# Patient Record
Sex: Male | Born: 1935 | Race: White | Hispanic: No | Marital: Married | Smoking: Former smoker
Health system: Southern US, Community
[De-identification: ages and names within clinical notes are randomized; demographics above are authoritative.]

## PROBLEM LIST (undated history)

## (undated) DIAGNOSIS — R7303 Prediabetes: Secondary | ICD-10-CM

## (undated) DIAGNOSIS — E039 Hypothyroidism, unspecified: Secondary | ICD-10-CM

## (undated) DIAGNOSIS — M1990 Unspecified osteoarthritis, unspecified site: Secondary | ICD-10-CM

## (undated) DIAGNOSIS — H3530 Unspecified macular degeneration: Secondary | ICD-10-CM

## (undated) DIAGNOSIS — E7800 Pure hypercholesterolemia, unspecified: Secondary | ICD-10-CM

## (undated) DIAGNOSIS — I10 Essential (primary) hypertension: Secondary | ICD-10-CM

## (undated) DIAGNOSIS — N400 Enlarged prostate without lower urinary tract symptoms: Secondary | ICD-10-CM

## (undated) HISTORY — DX: Unspecified macular degeneration: H35.30

## (undated) HISTORY — DX: Hypothyroidism, unspecified: E03.9

## (undated) HISTORY — DX: Essential (primary) hypertension: I10

## (undated) HISTORY — DX: Unspecified osteoarthritis, unspecified site: M19.90

## (undated) HISTORY — DX: Benign prostatic hyperplasia without lower urinary tract symptoms: N40.0

## (undated) HISTORY — DX: Pure hypercholesterolemia, unspecified: E78.00

## (undated) HISTORY — DX: Prediabetes: R73.03

---

## 1998-08-12 ENCOUNTER — Other Ambulatory Visit: Admission: RE | Admit: 1998-08-12 | Discharge: 1998-08-12 | Payer: Self-pay | Admitting: Urology

## 2002-07-15 ENCOUNTER — Ambulatory Visit: Admission: RE | Admit: 2002-07-15 | Discharge: 2002-07-15 | Payer: Self-pay | Admitting: Family Medicine

## 2004-01-18 ENCOUNTER — Ambulatory Visit (HOSPITAL_COMMUNITY): Admission: RE | Admit: 2004-01-18 | Discharge: 2004-01-18 | Payer: Self-pay | Admitting: Family Medicine

## 2015-05-02 DIAGNOSIS — E039 Hypothyroidism, unspecified: Secondary | ICD-10-CM | POA: Diagnosis not present

## 2015-05-02 DIAGNOSIS — R51 Headache: Secondary | ICD-10-CM | POA: Diagnosis not present

## 2015-05-02 DIAGNOSIS — E119 Type 2 diabetes mellitus without complications: Secondary | ICD-10-CM | POA: Diagnosis not present

## 2015-05-02 DIAGNOSIS — I10 Essential (primary) hypertension: Secondary | ICD-10-CM | POA: Diagnosis not present

## 2015-05-02 DIAGNOSIS — N400 Enlarged prostate without lower urinary tract symptoms: Secondary | ICD-10-CM | POA: Diagnosis not present

## 2015-05-02 DIAGNOSIS — E7800 Pure hypercholesterolemia, unspecified: Secondary | ICD-10-CM | POA: Diagnosis not present

## 2015-05-09 ENCOUNTER — Encounter: Payer: Self-pay | Admitting: Neurology

## 2015-05-09 ENCOUNTER — Ambulatory Visit (INDEPENDENT_AMBULATORY_CARE_PROVIDER_SITE_OTHER): Payer: Medicare Other | Admitting: Neurology

## 2015-05-09 VITALS — BP 171/68 | HR 65 | Ht 65.0 in | Wt 188.0 lb

## 2015-05-09 DIAGNOSIS — E038 Other specified hypothyroidism: Secondary | ICD-10-CM

## 2015-05-09 DIAGNOSIS — I10 Essential (primary) hypertension: Secondary | ICD-10-CM | POA: Insufficient documentation

## 2015-05-09 DIAGNOSIS — E039 Hypothyroidism, unspecified: Secondary | ICD-10-CM | POA: Insufficient documentation

## 2015-05-09 DIAGNOSIS — H3530 Unspecified macular degeneration: Secondary | ICD-10-CM | POA: Diagnosis not present

## 2015-05-09 DIAGNOSIS — M5481 Occipital neuralgia: Secondary | ICD-10-CM

## 2015-05-09 MED ORDER — GABAPENTIN 300 MG PO CAPS: 300.0000 mg | ORAL_CAPSULE | Freq: Three times a day (TID) | ORAL | Status: DC | PRN

## 2015-05-09 NOTE — Progress Notes (Addendum)
[facility]    Provider:  Dr Lucia Gaskins Referring Provider: Merri Brunette, MD Primary Care Physician:  Allean Found, MD  CC:  headaches  HPI:  Marvin Brooks is a 82 y.o. male here as a referral from Dr. Katrinka Blazing for headaches. PMHx HTN, hypothyroidism, macular degeneration, glucose intolerance. Patient describes pain like a flash of pain behind the ear and would switch sides. Started about 3 weeks ago. Nothing happened 3 weekds ago, no inciting factors. He feels swollen behind the ears. A little flash of pain. It can radiate up from the skull base.  He can't describe it. Not sharp or pointed just a wide section of popping, he points to the occipital and posterior auricular area. Getting better over the last 3 weeks. Today he doesn't have any pain. It would last off and on all day. It would be unilateral but once was bilateral and extremely painful, would last all day. Heating pad would help. Tylenol helps. Every few seconds would feel pain in the area. A throbbing pain. No tearing of the eyes, no runny nose, no autonomic symptoms. No new vision changes. He may have had it a few years ago and resolved. He has no history if headaches. No associated symptoms. No neck pain or radicular symptoms. The area can be sensitive and feel swollen.   Reviewed notes, labs and imaging from outside physicians, which showed:  ldl 65, cmp unremarkable with creatinine 1.12 on 05/02/2015. a1c 5.7. tsh 1.31, sed rate 23, cbc unremarkable.   Reviewed records from [place]. Patient has prediabetes, HTn on amlodipine, HLD on atorvastatin, enlarged prostate on terazosin, bilateral headaches behind the ears and was referred to [place] for evaluation. Sed rate was normal and no vision changes, jaw claudication, neck pain or fevers.   Review of Systems: Patient complains of symptoms per HPI as well as the following symptoms: loss of vision, swelling in legs, joint pain, joint swelling, cramps, aching muscles,  restless legs. Pertinent negatives per HPI. All others negative.   Social History   Social History  . Marital Status: Single    Spouse Name: N/A  . Number of Children: N/A  . Years of Education: N/A   Occupational History  . Not on file.   Social History Main Topics  . Smoking status: Former Games developer  . Smokeless tobacco: Not on file  . Alcohol Use: No  . Drug Use: Not on file  . Sexual Activity: Not on file   Other Topics Concern  . Not on file   Social History Narrative  . No narrative on file    Family History  Problem Relation Age of Onset  . Migraines Father     Past Medical History  Diagnosis Date  . Hypertension   . BPH (benign prostatic hyperplasia)   . Arthritis   . Hypothyroidism   . Hypercholesteremia   . Pre-diabetes   . Macular degeneration     History reviewed. No pertinent past surgical history.  Current Outpatient Prescriptions  Medication Sig Dispense Refill  . amLODipine (NORVASC) 10 MG tablet TK 1 T PO QD  1  . atorvastatin (LIPITOR) 40 MG tablet TK 1 T PO QD  1  . finasteride (PROSCAR) 5 MG tablet TK 1 T PO QD  1  . levothyroxine (SYNTHROID, LEVOTHROID) 100 MCG tablet TK 1 T PO QD  1  . terazosin (HYTRIN) 10 MG capsule TK 1 C PO QD  1   No current facility-administered medications for this visit.  Allergies as of 05/09/2015 - Review Complete 05/09/2015  Allergen Reaction Noted  . Sulfur  05/09/2015    Vitals: BP 171/68 mmHg  Pulse 65  Ht  (1.651 m)  Wt 188 lb (85.276 kg)  BMI 31.28 kg/m2 Last Weight:  Wt Readings from Last 1 Encounters:  05/09/15 188 lb (85.276 kg)   Last Height:   Ht Readings from Last 1 Encounters:  05/09/15  (1.651 m)    Physical exam: Exam: Gen: NAD, conversant, well nourised, obese, well groomed                     CV: RRR, no MRG. No Carotid Bruits. No peripheral edema, warm, nontender Eyes: Conjunctivae clear without exudates or hemorrhage  Neuro: Detailed Neurologic  Exam  Speech:    Speech is normal; fluent and spontaneous with normal comprehension.  Cognition:    The patient is oriented to person, place, and time;     recent and remote memory intact;     language fluent;     normal attention, concentration,     fund of knowledge Cranial Nerves:    The pupils are equal, round, and reactive to light. Attempted could not visualize the fundi.  Visual fields are impaired to finger confrontation due to macular degeneration. Extraocular movements are intact. Trigeminal sensation is intact and the muscles of mastication are normal. The face is symmetric. The palate elevates in the midline. Hearing intact. Voice is normal. Shoulder shrug is normal. The tongue has normal motion without fasciculations.   Coordination:    Normal finger to nose and heel to shin.   Gait:    Wide based  Motor Observation:    No asymmetry, no atrophy, and no involuntary movements noted. Tone:    Normal muscle tone.    Posture:    Posture is normal. normal erect    Strength:    Strength is V/V in the upper and lower limbs.      Sensation: intact to LT     Reflex Exam:  DTR's: Absent AJs otherwise deep tendon reflexes in the upper and lower extremities are normal bilaterally.   Toes:    The toes are downgoing bilaterally.   Clonus:    Clonus is absent.     Assessment/Plan:  82  year old male with new onset pain behind the ears and radiating to the skull base in the occipital area. Pain is bilateral and is located in the distribution of the lesser and/or great auricular nerves, paroxysmal and brief, painful, with tenderness at the emergence of the lesser occipital nerve at the skull base. Differential includes pain in the upper cervical joints or disks, suboccipital or upper posterior neck muscles including the traps/scm, spinal and posterior cranial fossa dura mater, vertebral arteries, structural and infiltrative lesions such as meningioma, schwannoma, myelitis,  compressive disk disease and others. Will need MRI of the brain and cervical spine.   Patient declines MRI of the brain and cervical spine at this time. He says that he is feeling better. Advise patient if symptoms recur should order imaging of the back for nerve blocks. We'll order Neurontin at this time as needed for pain. Return as needed symptoms recur.  To prevent or relieve headaches, try the following: Cool Compress. Lie down and place a cool compress on your head.  Avoid headache triggers. If certain foods or odors seem to have triggered your migraines in the past, avoid them. A headache diary might help you  identify triggers.  Include physical activity in your daily routine. Try a daily walk or other moderate aerobic exercise.  Manage stress. Find healthy ways to cope with the stressors, such as delegating tasks on your to-do list.  Practice relaxation techniques. Try deep breathing, yoga, massage and visualization.  Eat regularly. Eating regularly scheduled meals and maintaining a healthy diet might help prevent headaches. Also, drink plenty of fluids.  Follow a regular sleep schedule. Sleep deprivation might contribute to headaches Consider biofeedback. With this mind-body technique, you learn to control certain bodily functions - such as muscle tension, heart rate and blood pressure - to prevent headaches or reduce headache pain.    Proceed to emergency room if you experience new or worsening symptoms or symptoms do not resolve, if you have new neurologic symptoms or if headache is severe, or for any concerning symptom.   There are multiple side effects from Neurontin and most common or the following: dizziness, drowsiness, weakness, tired feeling, nausea, diarrhea, constipation, blurred vision, headache, breast swelling, dry mouth, loss of balance or coordination. But stopped for anything concerning. Take medication on weekend when not driving. Do not drive or operate machinery until he  now the medication works.   Naomie Dean, MD  [facility] [address] [place], [place] [address]  Phone [phone] Fax [phone]

## 2015-05-09 NOTE — Patient Instructions (Signed)
Remember to drink plenty of fluid, eat healthy meals and do not skip any meals. Try to eat protein with a every meal and eat a healthy snack such as fruit or nuts in between meals. Try to keep a regular sleep-wake schedule and try to exercise daily, particularly in the form of walking, 20-30 minutes a day, if you can.   As far as your medications are concerned, I would like to suggest: Neurontin  as needed three times a day  As far as diagnostic testing: recommend mri of the brain and cervical spine if symptoms return  I would like to see you back as needed, sooner if we need to. Please call us with any interim questions, concerns, problems, updates or refill requests.   Our phone number is [phone]. We also have an after hours call service for urgent matters and there is a physician on-call for urgent questions. For any emergencies you know to call 911 or go to the nearest emergency room

## 2015-09-07 DIAGNOSIS — H353134 Nonexudative age-related macular degeneration, bilateral, advanced atrophic with subfoveal involvement: Secondary | ICD-10-CM | POA: Diagnosis not present

## 2015-11-02 DIAGNOSIS — E7800 Pure hypercholesterolemia, unspecified: Secondary | ICD-10-CM | POA: Diagnosis not present

## 2015-11-02 DIAGNOSIS — E039 Hypothyroidism, unspecified: Secondary | ICD-10-CM | POA: Diagnosis not present

## 2015-11-02 DIAGNOSIS — I10 Essential (primary) hypertension: Secondary | ICD-10-CM | POA: Diagnosis not present

## 2015-11-02 DIAGNOSIS — N400 Enlarged prostate without lower urinary tract symptoms: Secondary | ICD-10-CM | POA: Diagnosis not present

## 2015-11-02 DIAGNOSIS — E119 Type 2 diabetes mellitus without complications: Secondary | ICD-10-CM | POA: Diagnosis not present

## 2015-11-30 DIAGNOSIS — Z23 Encounter for immunization: Secondary | ICD-10-CM | POA: Diagnosis not present

## 2015-12-12 DIAGNOSIS — H524 Presbyopia: Secondary | ICD-10-CM | POA: Diagnosis not present

## 2016-01-03 DIAGNOSIS — H26492 Other secondary cataract, left eye: Secondary | ICD-10-CM | POA: Diagnosis not present

## 2016-09-18 DIAGNOSIS — Z1389 Encounter for screening for other disorder: Secondary | ICD-10-CM | POA: Diagnosis not present

## 2016-09-18 DIAGNOSIS — E039 Hypothyroidism, unspecified: Secondary | ICD-10-CM | POA: Diagnosis not present

## 2016-09-18 DIAGNOSIS — Z0000 Encounter for general adult medical examination without abnormal findings: Secondary | ICD-10-CM | POA: Diagnosis not present

## 2016-09-18 DIAGNOSIS — I10 Essential (primary) hypertension: Secondary | ICD-10-CM | POA: Diagnosis not present

## 2016-12-14 DIAGNOSIS — H353134 Nonexudative age-related macular degeneration, bilateral, advanced atrophic with subfoveal involvement: Secondary | ICD-10-CM | POA: Diagnosis not present

## 2016-12-26 DIAGNOSIS — Z23 Encounter for immunization: Secondary | ICD-10-CM | POA: Diagnosis not present

## 2017-05-21 DIAGNOSIS — N400 Enlarged prostate without lower urinary tract symptoms: Secondary | ICD-10-CM | POA: Diagnosis not present

## 2017-05-21 DIAGNOSIS — I10 Essential (primary) hypertension: Secondary | ICD-10-CM | POA: Diagnosis not present

## 2017-05-21 DIAGNOSIS — E119 Type 2 diabetes mellitus without complications: Secondary | ICD-10-CM | POA: Diagnosis not present

## 2017-05-21 DIAGNOSIS — E039 Hypothyroidism, unspecified: Secondary | ICD-10-CM | POA: Diagnosis not present

## 2017-07-31 DIAGNOSIS — N5089 Other specified disorders of the male genital organs: Secondary | ICD-10-CM | POA: Diagnosis not present

## 2017-07-31 DIAGNOSIS — R310 Gross hematuria: Secondary | ICD-10-CM | POA: Diagnosis not present

## 2017-07-31 DIAGNOSIS — R351 Nocturia: Secondary | ICD-10-CM | POA: Diagnosis not present

## 2017-07-31 DIAGNOSIS — R350 Frequency of micturition: Secondary | ICD-10-CM | POA: Diagnosis not present

## 2017-08-07 DIAGNOSIS — N401 Enlarged prostate with lower urinary tract symptoms: Secondary | ICD-10-CM | POA: Diagnosis not present

## 2017-08-07 DIAGNOSIS — R310 Gross hematuria: Secondary | ICD-10-CM | POA: Diagnosis not present

## 2017-08-07 DIAGNOSIS — N281 Cyst of kidney, acquired: Secondary | ICD-10-CM | POA: Diagnosis not present

## 2017-08-27 DIAGNOSIS — R310 Gross hematuria: Secondary | ICD-10-CM | POA: Diagnosis not present

## 2017-08-27 DIAGNOSIS — N281 Cyst of kidney, acquired: Secondary | ICD-10-CM | POA: Diagnosis not present

## 2017-08-27 DIAGNOSIS — R350 Frequency of micturition: Secondary | ICD-10-CM | POA: Diagnosis not present

## 2017-08-27 DIAGNOSIS — C641 Malignant neoplasm of right kidney, except renal pelvis: Secondary | ICD-10-CM | POA: Diagnosis not present

## 2017-09-30 DIAGNOSIS — Z1389 Encounter for screening for other disorder: Secondary | ICD-10-CM | POA: Diagnosis not present

## 2017-09-30 DIAGNOSIS — Z0000 Encounter for general adult medical examination without abnormal findings: Secondary | ICD-10-CM | POA: Diagnosis not present

## 2017-09-30 DIAGNOSIS — I10 Essential (primary) hypertension: Secondary | ICD-10-CM | POA: Diagnosis not present

## 2017-09-30 DIAGNOSIS — E039 Hypothyroidism, unspecified: Secondary | ICD-10-CM | POA: Diagnosis not present

## 2018-02-11 DIAGNOSIS — C641 Malignant neoplasm of right kidney, except renal pelvis: Secondary | ICD-10-CM | POA: Diagnosis not present

## 2018-02-19 DIAGNOSIS — C641 Malignant neoplasm of right kidney, except renal pelvis: Secondary | ICD-10-CM | POA: Diagnosis not present

## 2018-02-19 DIAGNOSIS — N2889 Other specified disorders of kidney and ureter: Secondary | ICD-10-CM | POA: Diagnosis not present

## 2018-02-24 DIAGNOSIS — N281 Cyst of kidney, acquired: Secondary | ICD-10-CM | POA: Diagnosis not present

## 2018-02-24 DIAGNOSIS — R310 Gross hematuria: Secondary | ICD-10-CM | POA: Diagnosis not present

## 2018-02-24 DIAGNOSIS — C641 Malignant neoplasm of right kidney, except renal pelvis: Secondary | ICD-10-CM | POA: Diagnosis not present

## 2018-02-24 DIAGNOSIS — R351 Nocturia: Secondary | ICD-10-CM | POA: Diagnosis not present

## 2018-03-31 DIAGNOSIS — E7800 Pure hypercholesterolemia, unspecified: Secondary | ICD-10-CM | POA: Diagnosis not present

## 2018-03-31 DIAGNOSIS — I10 Essential (primary) hypertension: Secondary | ICD-10-CM | POA: Diagnosis not present

## 2018-03-31 DIAGNOSIS — E039 Hypothyroidism, unspecified: Secondary | ICD-10-CM | POA: Diagnosis not present

## 2018-03-31 DIAGNOSIS — E1169 Type 2 diabetes mellitus with other specified complication: Secondary | ICD-10-CM | POA: Diagnosis not present

## 2018-06-15 ENCOUNTER — Inpatient Hospital Stay (HOSPITAL_COMMUNITY)
Admission: EM | Admit: 2018-06-15 | Discharge: 2018-06-17 | DRG: 193 | Disposition: A | Discharge status: Home Health | Payer: Medicare Other | Attending: Internal Medicine | Admitting: Internal Medicine

## 2018-06-15 ENCOUNTER — Emergency Department (HOSPITAL_COMMUNITY): Payer: Medicare Other

## 2018-06-15 ENCOUNTER — Other Ambulatory Visit: Payer: Self-pay

## 2018-06-15 ENCOUNTER — Encounter (HOSPITAL_COMMUNITY): Payer: Self-pay | Admitting: Pharmacy Technician

## 2018-06-15 DIAGNOSIS — I10 Essential (primary) hypertension: Secondary | ICD-10-CM | POA: Diagnosis present

## 2018-06-15 DIAGNOSIS — M5481 Occipital neuralgia: Secondary | ICD-10-CM | POA: Diagnosis not present

## 2018-06-15 DIAGNOSIS — Z9181 History of falling: Secondary | ICD-10-CM

## 2018-06-15 DIAGNOSIS — I491 Atrial premature depolarization: Secondary | ICD-10-CM | POA: Diagnosis not present

## 2018-06-15 DIAGNOSIS — J811 Chronic pulmonary edema: Secondary | ICD-10-CM | POA: Diagnosis not present

## 2018-06-15 DIAGNOSIS — Z79899 Other long term (current) drug therapy: Secondary | ICD-10-CM | POA: Diagnosis not present

## 2018-06-15 DIAGNOSIS — I440 Atrioventricular block, first degree: Secondary | ICD-10-CM | POA: Diagnosis not present

## 2018-06-15 DIAGNOSIS — D649 Anemia, unspecified: Secondary | ICD-10-CM | POA: Diagnosis not present

## 2018-06-15 DIAGNOSIS — N400 Enlarged prostate without lower urinary tract symptoms: Secondary | ICD-10-CM | POA: Diagnosis not present

## 2018-06-15 DIAGNOSIS — J189 Pneumonia, unspecified organism: Secondary | ICD-10-CM | POA: Diagnosis not present

## 2018-06-15 DIAGNOSIS — Z87891 Personal history of nicotine dependence: Secondary | ICD-10-CM | POA: Diagnosis not present

## 2018-06-15 DIAGNOSIS — J810 Acute pulmonary edema: Secondary | ICD-10-CM | POA: Diagnosis not present

## 2018-06-15 DIAGNOSIS — R05 Cough: Secondary | ICD-10-CM | POA: Diagnosis not present

## 2018-06-15 DIAGNOSIS — R042 Hemoptysis: Secondary | ICD-10-CM | POA: Diagnosis not present

## 2018-06-15 DIAGNOSIS — R0600 Dyspnea, unspecified: Secondary | ICD-10-CM

## 2018-06-15 DIAGNOSIS — J069 Acute upper respiratory infection, unspecified: Secondary | ICD-10-CM | POA: Diagnosis present

## 2018-06-15 DIAGNOSIS — J849 Interstitial pulmonary disease, unspecified: Secondary | ICD-10-CM

## 2018-06-15 DIAGNOSIS — R509 Fever, unspecified: Secondary | ICD-10-CM

## 2018-06-15 DIAGNOSIS — E038 Other specified hypothyroidism: Secondary | ICD-10-CM | POA: Diagnosis not present

## 2018-06-15 DIAGNOSIS — E039 Hypothyroidism, unspecified: Secondary | ICD-10-CM | POA: Diagnosis present

## 2018-06-15 DIAGNOSIS — H3530 Unspecified macular degeneration: Secondary | ICD-10-CM | POA: Diagnosis not present

## 2018-06-15 DIAGNOSIS — Z882 Allergy status to sulfonamides status: Secondary | ICD-10-CM | POA: Diagnosis not present

## 2018-06-15 DIAGNOSIS — E785 Hyperlipidemia, unspecified: Secondary | ICD-10-CM | POA: Diagnosis not present

## 2018-06-15 DIAGNOSIS — Z79890 Hormone replacement therapy (postmenopausal): Secondary | ICD-10-CM

## 2018-06-15 DIAGNOSIS — M2550 Pain in unspecified joint: Secondary | ICD-10-CM | POA: Diagnosis not present

## 2018-06-15 DIAGNOSIS — D72810 Lymphocytopenia: Secondary | ICD-10-CM | POA: Diagnosis not present

## 2018-06-15 DIAGNOSIS — J9601 Acute respiratory failure with hypoxia: Secondary | ICD-10-CM | POA: Diagnosis not present

## 2018-06-15 DIAGNOSIS — R7303 Prediabetes: Secondary | ICD-10-CM | POA: Diagnosis present

## 2018-06-15 DIAGNOSIS — Z209 Contact with and (suspected) exposure to unspecified communicable disease: Secondary | ICD-10-CM | POA: Diagnosis not present

## 2018-06-15 DIAGNOSIS — R0902 Hypoxemia: Secondary | ICD-10-CM | POA: Diagnosis not present

## 2018-06-15 DIAGNOSIS — I959 Hypotension, unspecified: Secondary | ICD-10-CM | POA: Diagnosis not present

## 2018-06-15 DIAGNOSIS — Z20828 Contact with and (suspected) exposure to other viral communicable diseases: Secondary | ICD-10-CM | POA: Diagnosis present

## 2018-06-15 DIAGNOSIS — Z7401 Bed confinement status: Secondary | ICD-10-CM | POA: Diagnosis not present

## 2018-06-15 LAB — CBC WITH DIFFERENTIAL/PLATELET
Abs Immature Granulocytes: 0.03 10*3/uL (ref 0.00–0.07)
Basophils Absolute: 0 10*3/uL (ref 0.0–0.1)
Basophils Relative: 0 %
Eosinophils Absolute: 0 10*3/uL (ref 0.0–0.5)
Eosinophils Relative: 0 %
HCT: 34.8 % — ABNORMAL LOW (ref 39.0–52.0)
Hemoglobin: 10.7 g/dL — ABNORMAL LOW (ref 13.0–17.0)
Immature Granulocytes: 0 %
Lymphocytes Relative: 3 %
Lymphs Abs: 0.3 10*3/uL — ABNORMAL LOW (ref 0.7–4.0)
MCH: 28.2 pg (ref 26.0–34.0)
MCHC: 30.7 g/dL (ref 30.0–36.0)
MCV: 91.6 fL (ref 80.0–100.0)
Monocytes Absolute: 0.3 10*3/uL (ref 0.1–1.0)
Monocytes Relative: 4 %
Neutro Abs: 7.4 10*3/uL (ref 1.7–7.7)
Neutrophils Relative %: 93 %
Platelets: 145 10*3/uL — ABNORMAL LOW (ref 150–400)
RBC: 3.8 MIL/uL — ABNORMAL LOW (ref 4.22–5.81)
RDW: 13.2 % (ref 11.5–15.5)
WBC: 8.1 10*3/uL (ref 4.0–10.5)
nRBC: 0 % (ref 0.0–0.2)

## 2018-06-15 LAB — URINALYSIS, ROUTINE W REFLEX MICROSCOPIC
Bilirubin Urine: NEGATIVE
Glucose, UA: NEGATIVE mg/dL
Hgb urine dipstick: NEGATIVE
Ketones, ur: 5 mg/dL — AB
Leukocytes,Ua: NEGATIVE
Nitrite: NEGATIVE
Protein, ur: 100 mg/dL — AB
Specific Gravity, Urine: 1.03 (ref 1.005–1.030)
pH: 5 (ref 5.0–8.0)

## 2018-06-15 LAB — COMPREHENSIVE METABOLIC PANEL
ALT: 13 U/L (ref 0–44)
AST: 19 U/L (ref 15–41)
Albumin: 3.3 g/dL — ABNORMAL LOW (ref 3.5–5.0)
Alkaline Phosphatase: 88 U/L (ref 38–126)
Anion gap: 8 (ref 5–15)
BUN: 24 mg/dL — ABNORMAL HIGH (ref 8–23)
CO2: 19 mmol/L — ABNORMAL LOW (ref 22–32)
Calcium: 9.1 mg/dL (ref 8.9–10.3)
Chloride: 113 mmol/L — ABNORMAL HIGH (ref 98–111)
Creatinine, Ser: 1.24 mg/dL (ref 0.61–1.24)
GFR calc Af Amer: 59 mL/min — ABNORMAL LOW (ref >60)
GFR calc non Af Amer: 51 mL/min — ABNORMAL LOW (ref >60)
Glucose, Bld: 170 mg/dL — ABNORMAL HIGH (ref 70–99)
Potassium: 3.7 mmol/L (ref 3.5–5.1)
Sodium: 140 mmol/L (ref 135–145)
Total Bilirubin: 0.7 mg/dL (ref 0.3–1.2)
Total Protein: 6.5 g/dL (ref 6.5–8.1)

## 2018-06-15 LAB — LACTIC ACID, PLASMA
Lactic Acid, Venous: 1.3 mmol/L (ref 0.5–1.9)
Lactic Acid, Venous: 1 mmol/L (ref 0.5–1.9)

## 2018-06-15 LAB — LACTATE DEHYDROGENASE: LDH: 156 U/L (ref 98–192)

## 2018-06-15 LAB — INFLUENZA PANEL BY PCR (TYPE A & B)
Influenza A By PCR: NEGATIVE
Influenza B By PCR: NEGATIVE

## 2018-06-15 LAB — C-REACTIVE PROTEIN: CRP: 1.4 mg/dL — ABNORMAL HIGH (ref <1.0)

## 2018-06-15 LAB — I-STAT TROPONIN, ED: Troponin i, poc: 0.02 ng/mL (ref 0.00–0.08)

## 2018-06-15 MED ORDER — SODIUM CHLORIDE 0.9 % IV SOLN
1.0000 g | INTRAVENOUS | Status: DC
  Administered 2018-06-16: 1 g via INTRAVENOUS
  Filled 2018-06-15: qty 10

## 2018-06-15 MED ORDER — GABAPENTIN 300 MG PO CAPS
300.0000 mg | ORAL_CAPSULE | Freq: Every day | ORAL | Status: DC
  Administered 2018-06-15 – 2018-06-16 (×2): 300 mg via ORAL
  Filled 2018-06-15 (×2): qty 1

## 2018-06-15 MED ORDER — ONDANSETRON HCL 4 MG/2ML IJ SOLN: 4.0000 mg | Freq: Four times a day (QID) | INTRAMUSCULAR | Status: DC | PRN

## 2018-06-15 MED ORDER — ONDANSETRON HCL 4 MG PO TABS: 4.0000 mg | ORAL_TABLET | Freq: Four times a day (QID) | ORAL | Status: DC | PRN

## 2018-06-15 MED ORDER — SODIUM CHLORIDE 0.9 % IV SOLN
500.0000 mg | Freq: Once | INTRAVENOUS | Status: AC
  Administered 2018-06-15: 500 mg via INTRAVENOUS
  Filled 2018-06-15: qty 500

## 2018-06-15 MED ORDER — AMLODIPINE BESYLATE 5 MG PO TABS
5.0000 mg | ORAL_TABLET | Freq: Every day | ORAL | Status: DC
  Administered 2018-06-16 – 2018-06-17 (×2): 5 mg via ORAL
  Filled 2018-06-15 (×2): qty 1

## 2018-06-15 MED ORDER — ATORVASTATIN CALCIUM 40 MG PO TABS
40.0000 mg | ORAL_TABLET | Freq: Every day | ORAL | Status: DC
  Administered 2018-06-16: 40 mg via ORAL
  Filled 2018-06-15: qty 1

## 2018-06-15 MED ORDER — ACETAMINOPHEN 325 MG PO TABS
650.0000 mg | ORAL_TABLET | Freq: Once | ORAL | Status: AC
  Administered 2018-06-15: 650 mg via ORAL
  Filled 2018-06-15: qty 2

## 2018-06-15 MED ORDER — FINASTERIDE 5 MG PO TABS
5.0000 mg | ORAL_TABLET | Freq: Every day | ORAL | Status: DC
  Administered 2018-06-16 – 2018-06-17 (×2): 5 mg via ORAL
  Filled 2018-06-15 (×2): qty 1

## 2018-06-15 MED ORDER — AZITHROMYCIN 250 MG PO TABS
500.0000 mg | ORAL_TABLET | ORAL | Status: DC
  Administered 2018-06-16: 500 mg via ORAL
  Filled 2018-06-15: qty 2

## 2018-06-15 MED ORDER — LEVOTHYROXINE SODIUM 100 MCG PO TABS
100.0000 ug | ORAL_TABLET | Freq: Every day | ORAL | Status: DC
  Administered 2018-06-16: 100 ug via ORAL
  Filled 2018-06-15 (×2): qty 1

## 2018-06-15 MED ORDER — ENOXAPARIN SODIUM 40 MG/0.4ML ~~LOC~~ SOLN
40.0000 mg | SUBCUTANEOUS | Status: DC
  Administered 2018-06-15 – 2018-06-16 (×2): 40 mg via SUBCUTANEOUS
  Filled 2018-06-15 (×3): qty 0.4

## 2018-06-15 MED ORDER — SODIUM CHLORIDE 0.9 % IV SOLN
1.0000 g | Freq: Once | INTRAVENOUS | Status: AC
  Administered 2018-06-15: 1 g via INTRAVENOUS
  Filled 2018-06-15: qty 10

## 2018-06-15 NOTE — ED Notes (Signed)
Pt in by EMS following a fall into the tub while in the BR.  States legs became weak and unable to stand.   EMS arrival able to stand with (A) only.  Alert and oriented.  No neuro deficits x 4 extremities.

## 2018-06-15 NOTE — ED Provider Notes (Signed)
Medical screening examination/treatment/procedure(s) were conducted as a shared visit with non-physician practitioner(s) and myself.  I personally evaluated the patient during the encounter.  Patient presents to the ED for shortness of breath and fever.  Patient is hemodynamically stable but he is tachypneic and has borderline O2 sats requires nasal cannula oxygen.  Patient likely has covid 19.  Plan on admission for further treatment.  Marvin Brooks was evaluated in Emergency Department on 06/15/2018 for the symptoms described in the history of present illness. He was evaluated in the context of the global COVID-19 pandemic, which necessitated consideration that the patient might be at risk for infection with the SARS-CoV-2 virus that causes COVID-19. Institutional protocols and algorithms that pertain to the evaluation of patients at risk for COVID-19 are in a state of rapid change based on information released by regulatory bodies including the CDC and federal and state organizations. These policies and algorithms were followed during the patient's care in the ED.    Linwood Dibbles, MD 06/15/18 Zollie Pee

## 2018-06-15 NOTE — H&P (Signed)
[facility]                                                                                    Patient Demographics  Marvin Brooks, is a 85 y.o. male  CSN: [id number]  MRN: [id number]  DOB - 04-26-1926  Admit Date - 06/15/2018  Outpatient Primary MD for the patient is Merri Brunette, MD   With History of -  Past Medical History:  Diagnosis Date  . Arthritis   . BPH (benign prostatic hyperplasia)   . Hypercholesteremia   . Hypertension   . Hypothyroidism   . Macular degeneration   . Pre-diabetes       No past surgical history on file.  in for   No chief complaint on file.    HPI  Marvin Brooks  is a 85 y.o. male, with past medical history significant for hypertension, hyperlipidemia and hypothyroidism in addition to macular degeneration who presented here originally after a fall in the bathtub, did not sustain any head injury or loss of consciousness.  No preceding chest pain.  Upon further questioning patient reported being progressively weak for the last 3 days with cough.  He denies feverishness at home.  His wife is at home is being treated for an URI. Work-up in the emergency room showed mild lymphopenia , fever and a chest x-ray positive for interstitial infiltrates. Nausea/vomiting/diarrhea CRP, flu test and LDH are pending    Review of Systems    In addition to the HPI above,  No Fever-chills, No Headache, No changes with Vision or hearing, No problems swallowing food or Liquids, No Chest pain,  No Abdominal pain, No Nausea or Vommitting, Bowel movements are regular, No Blood in stool or Urine, No dysuria, No new skin rashes or bruises, No new joints pains-aches,  No new weakness, tingling, numbness in any extremity, No recent weight gain or loss, No polyuria, polydypsia or polyphagia, No significant Mental Stressors.  A full 10 point Review of Systems was done, except as stated above, all other Review of Systems were  negative.   Social History Social History   Tobacco Use  . Smoking status: Former Smoker  Substance Use Topics  . Alcohol use: No     Family History Family History  Problem Relation Age of Onset  . Migraines Father      Prior to Admission medications   Medication Sig Start Date End Date Taking? Authorizing Provider  amLODipine (NORVASC) 10 MG tablet TK 1 T PO QD 03/08/15   [provider]  atorvastatin (LIPITOR) 40 MG tablet TK 1 T PO QD 05/02/15   [provider]  finasteride (PROSCAR) 5 MG tablet TK 1 T PO QD 05/02/15   [provider]  gabapentin (NEURONTIN) 300 MG capsule Take 1 capsule (300 mg total) by mouth 3 (three) times daily as needed. 05/09/15   Anson Fret, MD  levothyroxine (SYNTHROID, LEVOTHROID) 100 MCG tablet TK 1 T PO QD 05/02/15   [provider]  terazosin (HYTRIN) 10 MG capsule TK 1 C PO QD 05/02/15   [provider]    Allergies  Allergen Reactions  . Sulfur  Cause rash    Physical Exam  Vitals  Blood pressure (!) 156/66, pulse 67, temperature (!) 102.2 F (39 C), temperature source Rectal, resp. rate 17, height 5' 7.5" (1.715 m), weight 81.6 kg, SpO2 96 %.   1. General well-developed, well-nourished male extremely pleasant  2. Normal affect and insight, Not Suicidal or Homicidal, mildly confused.  3. No F.N deficits, grossly, patient moving all extremities.  4. Ears and Eyes appear Normal, Conjunctivae clear, PERRLA. Moist Oral Mucosa.  5. Supple Neck, No JVD, No cervical lymphadenopathy appriciated, No Carotid Bruits.  6. Symmetrical Chest wall movement, decreased breath sounds at the bases.  7. RRR, No Gallops, Rubs or Murmurs, No Parasternal Heave.  8. Positive Bowel Sounds, Abdomen Soft, Non tender, No organomegaly appriciated,No rebound -guarding or rigidity.  9.  No Cyanosis, Normal Skin Turgor, No Skin Rash or Bruise.  10. Good muscle tone,  joints appear normal , +1 peripheral  edema noted.    Data Review  CBC Recent Labs  Lab 06/15/18 1656  WBC 8.1  HGB 10.7*  HCT 34.8*  PLT 145*  MCV 91.6  MCH 28.2  MCHC 30.7  RDW 13.2  LYMPHSABS 0.3*  MONOABS 0.3  EOSABS 0.0  BASOSABS 0.0   ------------------------------------------------------------------------------------------------------------------  Chemistries  Recent Labs  Lab 06/15/18 1656  NA 140  K 3.7  CL 113*  CO2 19*  GLUCOSE 170*  BUN 24*  CREATININE 1.24  CALCIUM 9.1  AST 19  ALT 13  ALKPHOS 88  BILITOT 0.7   ------------------------------------------------------------------------------------------------------------------ estimated creatinine clearance is 41.7 mL/min (by C-G formula based on SCr of 1.24 mg/dL). ------------------------------------------------------------------------------------------------------------------ No results for input(s): TSH, T4TOTAL, T3FREE, THYROIDAB in the last 72 hours.  Invalid input(s): FREET3   Coagulation profile No results for input(s): INR, PROTIME in the last 168 hours. ------------------------------------------------------------------------------------------------------------------- No results for input(s): DDIMER in the last 72 hours. -------------------------------------------------------------------------------------------------------------------  Cardiac Enzymes No results for input(s): CKMB, TROPONINI, MYOGLOBIN in the last 168 hours.  Invalid input(s): CK ------------------------------------------------------------------------------------------------------------------ Invalid input(s): POCBNP   ---------------------------------------------------------------------------------------------------------------  Urinalysis    Component Value Date/Time   COLORURINE YELLOW 06/15/2018 1656   APPEARANCEUR HAZY (A) 06/15/2018 1656   LABSPEC 1.030 06/15/2018 1656   PHURINE 5.0 06/15/2018 1656   GLUCOSEU NEGATIVE 06/15/2018 1656    HGBUR NEGATIVE 06/15/2018 1656   BILIRUBINUR NEGATIVE 06/15/2018 1656   KETONESUR 5 (A) 06/15/2018 1656   PROTEINUR 100 (A) 06/15/2018 1656   NITRITE NEGATIVE 06/15/2018 1656   LEUKOCYTESUR NEGATIVE 06/15/2018 1656    ----------------------------------------------------------------------------------------------------------------   Imaging results:   Dg Chest Portable 1 View  Result Date: 06/15/2018 CLINICAL DATA:  Cough.  Fall. EXAM: PORTABLE CHEST 1 VIEW COMPARISON:  CT scan February 19, 2018 FINDINGS: Cardiomegaly. The hila and mediastinum are unremarkable. Increased interstitial markings, particularly in the bases. No focal infiltrate or other abnormality. IMPRESSION: 1. The increased interstitial opacities in the lung bases could represent mild edema or atypical infection. A chronic process such is interstitial lung disease is considered unlikely given no such findings on the CT scan from February 19, 2018. Electronically Signed   By: Gerome Sam III M.D   On: 06/15/2018 17:41      Assessment & Plan  Bilateral interstitial pneumonia with acute hypoxia Start Rocephin and Zithromax New with nasal cannula 2 L/min  URI with fever and lymphopenia and chest x-ray changes Flu a and B  Hypertension continue with Norvasc/Hytrin  Hypo-thyroidism Continue with Synthroid DVT Prophylaxis Lovenox  AM Labs Ordered, also please review Full  Orders    Code Status full  Disposition Plan: Home  Time spent in minutes : 42 minutes  Condition GUARDED   @SIGNATURE @

## 2018-06-15 NOTE — ED Notes (Signed)
ED TO INPATIENT HANDOFF REPORT  ED Nurse Name and Phone #: Prince Couey [phone]  S Name/Age/Gender Granville Lewis Crager 85 y.o. male Room/Bed: 018C/018C  Code Status   Code Status: Not on file  Home/SNF/Other Home Patient oriented to: self, place, time and situation Is this baseline? Yes   Triage Complete: Triage complete  Chief Complaint weakness  Triage Note No notes on file   Allergies Allergies  Allergen Reactions  . Sulfur     Cause rash    Level of Care/Admitting Diagnosis ED Disposition    ED Disposition Condition Comment   Admit  Hospital Area: [facility] [[id number]]  Level of Care: Med-Surg [16]  Diagnosis: Pneumonia [227785]  Admitting Physician: Carron Curie [[id number]]  Attending Physician: Sharyon Medicus, ALI Bai.Lain  Estimated length of stay: past midnight tomorrow  Certification:: I certify this patient will need inpatient services for at least 2 midnights  Bed request comments: COVID-19 low risk  PT Class (Do Not Modify): Inpatient [101]  PT Acc Code (Do Not Modify): Private [1]       B Medical/Surgery History Past Medical History:  Diagnosis Date  . Arthritis   . BPH (benign prostatic hyperplasia)   . Hypercholesteremia   . Hypertension   . Hypothyroidism   . Macular degeneration   . Pre-diabetes    No past surgical history on file.   A IV Location/Drains/Wounds Patient Lines/Drains/Airways Status   Active Line/Drains/Airways    Name:   Placement date:   Placement time:   Site:   Days:   Peripheral IV 06/15/18   06/15/18    1650    -   less than 1          Intake/Output Last 24 hours No intake or output data in the 24 hours ending 06/15/18 1903  Labs/Imaging Results for orders placed or performed during the hospital encounter of 06/15/18 (from the past 48 hour(s))  Lactic acid, plasma     Status: None   Collection Time: 06/15/18  4:55 PM  Result Value Ref Range   Lactic Acid, Venous 1.3 0.5 - 1.9 mmol/L    Comment: Performed  at [facility], [address] [place], [place] [address]  Comprehensive metabolic panel     Status: Abnormal   Collection Time: 06/15/18  4:56 PM  Result Value Ref Range   Sodium 140 135 - 145 mmol/L   Potassium 3.7 3.5 - 5.1 mmol/L   Chloride 113 (H) 98 - 111 mmol/L   CO2 19 (L) 22 - 32 mmol/L   Glucose, Bld 170 (H) 70 - 99 mg/dL   BUN 24 (H) 8 - 23 mg/dL   Creatinine, Ser 2.53 0.61 - 1.24 mg/dL   Calcium 9.1 8.9 - 66.4 mg/dL   Total Protein 6.5 6.5 - 8.1 g/dL   Albumin 3.3 (L) 3.5 - 5.0 g/dL   AST 19 15 - 41 U/L   ALT 13 0 - 44 U/L   Alkaline Phosphatase 88 38 - 126 U/L   Total Bilirubin 0.7 0.3 - 1.2 mg/dL   GFR calc non Af Amer 51 (L) >60 mL/min   GFR calc Af Amer 59 (L) >60 mL/min   Anion gap 8 5 - 15    Comment: Performed at [facility], [address], [place], [place] [address]  CBC WITH DIFFERENTIAL     Status: Abnormal   Collection Time: 06/15/18  4:56 PM  Result Value Ref Range   WBC 8.1 4.0 -  10.5 K/uL   RBC 3.80 (L) 4.22 - 5.81 MIL/uL   Hemoglobin 10.7 (L) 13.0 - 17.0 g/dL   HCT 40.9 (L) 81.1 - 91.4 %   MCV 91.6 80.0 - 100.0 fL   MCH 28.2 26.0 - 34.0 pg   MCHC 30.7 30.0 - 36.0 g/dL   RDW 78.2 95.6 - 21.3 %   Platelets 145 (L) 150 - 400 K/uL   nRBC 0.0 0.0 - 0.2 %   Neutrophils Relative % 93 %   Neutro Abs 7.4 1.7 - 7.7 K/uL   Lymphocytes Relative 3 %   Lymphs Abs 0.3 (L) 0.7 - 4.0 K/uL   Monocytes Relative 4 %   Monocytes Absolute 0.3 0.1 - 1.0 K/uL   Eosinophils Relative 0 %   Eosinophils Absolute 0.0 0.0 - 0.5 K/uL   Basophils Relative 0 %   Basophils Absolute 0.0 0.0 - 0.1 K/uL   Immature Granulocytes 0 %   Abs Immature Granulocytes 0.03 0.00 - 0.07 K/uL   Tear Drop Cells PRESENT     Comment: Performed at [facility], [address], [place], [place] [address]  Urinalysis, Routine w reflex microscopic     Status: Abnormal   Collection Time: 06/15/18  4:56 PM  Result Value Ref Range   Color, Urine YELLOW YELLOW   APPearance  HAZY (A) CLEAR   Specific Gravity, Urine 1.030 1.005 - 1.030   pH 5.0 5.0 - 8.0   Glucose, UA NEGATIVE NEGATIVE mg/dL   Hgb urine dipstick NEGATIVE NEGATIVE   Bilirubin Urine NEGATIVE NEGATIVE   Ketones, ur 5 (A) NEGATIVE mg/dL   Protein, ur 846 (A) NEGATIVE mg/dL   Nitrite NEGATIVE NEGATIVE   Leukocytes,Ua NEGATIVE NEGATIVE   RBC / HPF 0-5 0 - 5 RBC/hpf   WBC, UA 0-5 0 - 5 WBC/hpf   Bacteria, UA RARE (A) NONE SEEN   Squamous Epithelial / LPF 6-10 0 - 5   Mucus PRESENT     Comment: Performed at [facility], [address] [place], [place] [address]  I-Stat Troponin, ED (not at [facility])     Status: None   Collection Time: 06/15/18  5:14 PM  Result Value Ref Range   Troponin i, poc 0.02 0.00 - 0.08 ng/mL   Comment 3            Comment: Due to the release kinetics of cTnI, a negative result within the first hours of the onset of symptoms does not rule out myocardial infarction with certainty. If myocardial infarction is still suspected, repeat the test at appropriate intervals.    Dg Chest Portable 1 View  Result Date: 06/15/2018 CLINICAL DATA:  Cough.  Fall. EXAM: PORTABLE CHEST 1 VIEW COMPARISON:  CT scan February 19, 2018 FINDINGS: Cardiomegaly. The hila and mediastinum are unremarkable. Increased interstitial markings, particularly in the bases. No focal infiltrate or other abnormality. IMPRESSION: 1. The increased interstitial opacities in the lung bases could represent mild edema or atypical infection. A chronic process such is interstitial lung disease is considered unlikely given no such findings on the CT scan from February 19, 2018. Electronically Signed   By: Gerome Sam III M.D   On: 06/15/2018 17:41    Pending Labs Unresulted Labs (From admission, onward)    Start     Ordered   06/15/18 1648  Influenza panel by PCR (type A & B)  (Influenza PCR Panel)  Once,   R     06/15/18 1647  06/15/18 1647  Lactic acid, plasma  Now then every 2 hours,   STAT     06/15/18  1647   06/15/18 1647  Blood Culture (routine x 2)  BLOOD CULTURE X 2,   STAT     06/15/18 1647   Signed and Held  C-reactive protein  Once,   R     Signed and Held   Signed and Held  Lactate dehydrogenase  Once,   R     Signed and Held          Vitals/Pain Today's Vitals   06/15/18 1838 06/15/18 1838 06/15/18 1845 06/15/18 1900  BP:    (!) 156/66  Pulse:   67   Resp:   17   Temp:      TempSrc:      SpO2:   95% 96%  Weight:      Height:      PainSc: 0-No pain 0-No pain      Isolation Precautions Droplet precaution  Medications Medications  cefTRIAXone (ROCEPHIN) 1 g in sodium chloride 0.9 % 100 mL IVPB (has no administration in time range)  azithromycin (ZITHROMAX) 500 mg in sodium chloride 0.9 % 250 mL IVPB (has no administration in time range)  acetaminophen (TYLENOL) tablet 650 mg (650 mg Oral Given 06/15/18 1715)    Mobility walks Moderate fall risk   Focused Assessments Pulmonary Assessment Handoff:  Lung sounds:   O2 Device: Nasal Cannula O2 Flow Rate (L/min): 2 L/min      R Recommendations: See Admitting Provider Note  Report given to:   Additional Notes:

## 2018-06-15 NOTE — ED Provider Notes (Signed)
[facility] EMERGENCY DEPARTMENT Provider Note   CSN: [id number] Arrival date & time: 06/15/18  1632    History   Chief Complaint No chief complaint on file.   HPI Marvin Brooks is a 85 y.o. male With history of arthritis, BPH, HLD, HTN, hypothyroidism, macular degeneration, prediabetes presents for evaluation of gradual onset, progressively worsening cough and weakness for 3 days.  Reports generalized weakness and earlier today was in a bathtub when he began to feel weak and fell.  He does not think that he sustained any head injury or loss of consciousness.  He states that he does not know why he was in the bathtub "but I was probably going to take a shower ".  Appears mildly confused.  Reports nonproductive cough, mild nasal congestion.  Is unsure if he has had any fevers.  Does report shortness of breath, denies chest pain, abdominal pain, nausea, vomiting, or urinary symptoms.  Has tried warm water salt gargles with some relief.  States that his wife has had similar symptoms for the last month or so.  No recent travel or known exposure to a COVID-19 positive person.     The history is provided by the patient.    Past Medical History:  Diagnosis Date   Arthritis    BPH (benign prostatic hyperplasia)    Hypercholesteremia    Hypertension    Hypothyroidism    Macular degeneration    Pre-diabetes     Patient Active Problem List   Diagnosis Date Noted   Pneumonia 06/15/2018   HTN (hypertension) 05/09/2015   Hypothyroidism 05/09/2015   Bilateral occipital neuralgia 05/09/2015   Macular degeneration 05/09/2015    No past surgical history on file.      Home Medications    Prior to Admission medications   Medication Sig Start Date End Date Taking? Authorizing Provider  acetaminophen (TYLENOL) 500 MG tablet Take 500-1,000 mg by mouth every 6 (six) hours as needed for headache (pain).   Yes [provider]  amLODipine (NORVASC) 10  MG tablet Take 10 mg by mouth daily.  03/08/15  Yes [provider]  atorvastatin (LIPITOR) 40 MG tablet Take 40 mg by mouth daily.  05/02/15  Yes [provider]  finasteride (PROSCAR) 5 MG tablet Take 5 mg by mouth daily.  05/02/15  Yes [provider]  levothyroxine (SYNTHROID, LEVOTHROID) 100 MCG tablet Take 100 mcg by mouth daily.  05/02/15  Yes [provider]  Multiple Vitamins-Minerals (PRESERVISION AREDS 2) CAPS Take 1 capsule by mouth daily.   Yes [provider]  oxymetazoline (AFRIN) 0.05 % nasal spray Place 1 spray into both nostrils 2 (two) times daily as needed for congestion.   Yes [provider]  terazosin (HYTRIN) 10 MG capsule Take 10 mg by mouth daily.  05/02/15  Yes [provider]  gabapentin (NEURONTIN) 300 MG capsule Take 1 capsule (300 mg total) by mouth 3 (three) times daily as needed. Patient not taking: Reported on 06/15/2018 05/09/15   Anson Fret, MD    Family History Family History  Problem Relation Age of Onset   Migraines Father     Social History Social History   Tobacco Use   Smoking status: Former Smoker  Substance Use Topics   Alcohol use: No   Drug use: Not on file     Allergies   Sulfur and Sulfa antibiotics   Review of Systems Review of Systems  Constitutional: Positive for chills and fever.  HENT: Positive for congestion.   Respiratory: Positive for cough and shortness of breath.   Cardiovascular: Negative for chest pain.  Gastrointestinal: Negative for abdominal pain, nausea and vomiting.  Genitourinary: Negative for dysuria and urgency.  All other systems reviewed and are negative.    Physical Exam Updated Vital Signs BP (!) 158/72 (BP Location: Right Arm)    Pulse 63    Temp 98.4 F (36.9 C) (Oral)    Resp 20    Ht 5\' 7"  (1.702 m)    Wt 76.2 kg    SpO2 94%    BMI 26.31 kg/m   Physical Exam Vitals signs and nursing note reviewed.  Constitutional:       General: He is not in acute distress.    Appearance: He is well-developed.  HENT:     Head: Normocephalic and atraumatic.     Comments: No Battle's signs, no raccoon's eyes, no rhinorrhea. No hemotympanum. No tenderness to palpation of the face or skull. No deformity, crepitus, or swelling noted.  Eyes:     General:        Right eye: No discharge.        Left eye: No discharge.     Extraocular Movements: Extraocular movements intact.     Conjunctiva/sclera: Conjunctivae normal.     Pupils: Pupils are equal, round, and reactive to light.  Neck:     Musculoskeletal: Normal range of motion and neck supple.     Vascular: No JVD.     Trachea: No tracheal deviation.  Cardiovascular:     Rate and Rhythm: Normal rate.     Pulses: Normal pulses.     Comments: Regularly irregular rhythm, 2+ radial and DP/PT pulses bilaterally, no lower extremity edema, no palpable cords, compartments are soft  Pulmonary:     Comments: Tachypneic.  Soft bibasilar crackles noted Abdominal:     General: Abdomen is flat. There is no distension.     Tenderness: There is no abdominal tenderness. There is no guarding or rebound.  Musculoskeletal:        General: No tenderness or deformity.  Skin:    General: Skin is warm and dry.     Findings: No erythema.  Neurological:     General: No focal deficit present.     Mental Status: He is alert.     Cranial Nerves: No cranial nerve deficit.     Sensory: No sensory deficit.     Motor: No weakness.     Comments: Fluent speech with no evidence of dysarthria or aphasia.  No facial droop.  Moves extremities spontaneously with intact strength.  Good grip strength bilaterally.  No pronator drift.  Sensation intact to soft touch of bilateral upper and lower extremities.  Cranial nerves appear grossly intact.  Oriented to person place and year but not month or day of week.  Psychiatric:        Behavior: Behavior normal.      ED Treatments / Results  Labs (all labs  ordered are listed, but only abnormal results are displayed) Labs Reviewed  COMPREHENSIVE METABOLIC PANEL - Abnormal; Notable for the following components:      Result Value   Chloride 113 (*)    CO2 19 (*)    Glucose, Bld 170 (*)    BUN 24 (*)    Albumin 3.3 (*)    GFR calc non Af Amer 51 (*)    GFR calc Af Amer 59 (*)    All other components  within normal limits  CBC WITH DIFFERENTIAL/PLATELET - Abnormal; Notable for the following components:   RBC 3.80 (*)    Hemoglobin 10.7 (*)    HCT 34.8 (*)    Platelets 145 (*)    Lymphs Abs 0.3 (*)    All other components within normal limits  URINALYSIS, ROUTINE W REFLEX MICROSCOPIC - Abnormal; Notable for the following components:   APPearance HAZY (*)    Ketones, ur 5 (*)    Protein, ur 100 (*)    Bacteria, UA RARE (*)    All other components within normal limits  CULTURE, BLOOD (ROUTINE X 2)  CULTURE, BLOOD (ROUTINE X 2)  EXPECTORATED SPUTUM ASSESSMENT W REFEX TO RESP CULTURE  GRAM STAIN  LACTIC ACID, PLASMA  INFLUENZA PANEL BY PCR (TYPE A & B)  LACTIC ACID, PLASMA  C-REACTIVE PROTEIN  LACTATE DEHYDROGENASE  STREP PNEUMONIAE URINARY ANTIGEN  I-STAT TROPONIN, ED    EKG None  Radiology Dg Chest Portable 1 View  Result Date: 06/15/2018 CLINICAL DATA:  Cough.  Fall. EXAM: PORTABLE CHEST 1 VIEW COMPARISON:  CT scan February 19, 2018 FINDINGS: Cardiomegaly. The hila and mediastinum are unremarkable. Increased interstitial markings, particularly in the bases. No focal infiltrate or other abnormality. IMPRESSION: 1. The increased interstitial opacities in the lung bases could represent mild edema or atypical infection. A chronic process such is interstitial lung disease is considered unlikely given no such findings on the CT scan from February 19, 2018. Electronically Signed   By: Gerome Sam III M.D   On: 06/15/2018 17:41    Procedures Procedures (including critical care time)  Medications Ordered in ED Medications    amLODipine (NORVASC) tablet 5 mg (has no administration in time range)  atorvastatin (LIPITOR) tablet 40 mg (has no administration in time range)  levothyroxine (SYNTHROID, LEVOTHROID) tablet 100 mcg (has no administration in time range)  finasteride (PROSCAR) tablet 5 mg (has no administration in time range)  gabapentin (NEURONTIN) capsule 300 mg (has no administration in time range)  enoxaparin (LOVENOX) injection 40 mg (has no administration in time range)  ondansetron (ZOFRAN) tablet 4 mg (has no administration in time range)    Or  ondansetron (ZOFRAN) injection 4 mg (has no administration in time range)  cefTRIAXone (ROCEPHIN) 1 g in sodium chloride 0.9 % 100 mL IVPB (has no administration in time range)  azithromycin (ZITHROMAX) tablet 500 mg (has no administration in time range)  acetaminophen (TYLENOL) tablet 650 mg (650 mg Oral Given 06/15/18 1715)  cefTRIAXone (ROCEPHIN) 1 g in sodium chloride 0.9 % 100 mL IVPB (0 g Intravenous Stopped 06/15/18 1948)  azithromycin (ZITHROMAX) 500 mg in sodium chloride 0.9 % 250 mL IVPB (500 mg Intravenous New Bag/Given 06/15/18 1906)     Initial Impression / Assessment and Plan / ED Course  I have reviewed the triage vital signs and the nursing notes.  Pertinent labs & imaging results that were available during my care of the patient were reviewed by me and considered in my medical decision making (see chart for details).        Patient presenting for evaluation of fever, cough, shortness of breath, generalized weakness.  Rectal temp of 102.2 F in the ED.  He is tachypneic and borderline hypoxic on initial presentation with improvement on 2 L via nasal cannula.  No focal neurologic deficits, no evidence of head injury.  He is not anticoagulated.  I do not feel that he requires any head imaging secondary to his fall at this  time.  Lab work reviewed by me shows no leukocytosis, mild anemia.  He has a lymphopenia, mildly decreased bicarb.  Normal  anion gap.  Lactate is negative.  UA does not suggest UTI or nephrolithiasis but does suggest mild dehydration.  He does not appear to be septic at this time.  EKG shows no ST segment abnormality or arrhythmia, troponin is negative.  Doubt ACS/MI. Chest x-ray concerning for interstitial opacities in the lung bases which could suggest edema versus atypical infection.  Influenza negative.  Given his age, comorbidities, increased work of breathing and hypoxia will require admission for further evaluation and management.  He was given IV antibiotics in the ED.  There is some concern for COVID-19.  Spoke with Dr. Sharyon Medicus with [facility] hospitalist service who agrees to assume care of patient and bring him into the hospital for further evaluation and management.  Patient seen and evaluated by Dr. Lynelle Doctor who agrees with assessment and plan at this time.   Marvin Brooks was evaluated in Emergency Department on 06/15/2018 for the symptoms described in the history of present illness. He was evaluated in the context of the global COVID-19 pandemic, which necessitated consideration that the patient might be at risk for infection with the SARS-CoV-2 virus that causes COVID-19. Institutional protocols and algorithms that pertain to the evaluation of patients at risk for COVID-19 are in a state of rapid change based on information released by regulatory bodies including the CDC and federal and state organizations. These policies and algorithms were followed during the patient's care in the ED.  Final Clinical Impressions(s) / ED Diagnoses   Final diagnoses:  Fever in adult  Multifocal pneumonia    ED Discharge Orders    None       Bennye Alm 06/15/18 2140    Linwood Dibbles, MD 06/17/18 [phone]

## 2018-06-16 ENCOUNTER — Encounter (HOSPITAL_COMMUNITY): Payer: Self-pay

## 2018-06-16 DIAGNOSIS — J189 Pneumonia, unspecified organism: Principal | ICD-10-CM

## 2018-06-16 DIAGNOSIS — I10 Essential (primary) hypertension: Secondary | ICD-10-CM

## 2018-06-16 DIAGNOSIS — E039 Hypothyroidism, unspecified: Secondary | ICD-10-CM

## 2018-06-16 DIAGNOSIS — J810 Acute pulmonary edema: Secondary | ICD-10-CM

## 2018-06-16 LAB — BLOOD CULTURE ID PANEL (REFLEXED)

## 2018-06-16 LAB — STREP PNEUMONIAE URINARY ANTIGEN: Strep Pneumo Urinary Antigen: NEGATIVE

## 2018-06-16 MED ORDER — FUROSEMIDE 10 MG/ML IJ SOLN
40.0000 mg | Freq: Once | INTRAMUSCULAR | Status: AC
  Administered 2018-06-16: 40 mg via INTRAVENOUS
  Filled 2018-06-16: qty 4

## 2018-06-16 MED ORDER — LORAZEPAM 0.5 MG PO TABS
0.5000 mg | ORAL_TABLET | Freq: Once | ORAL | Status: AC
  Administered 2018-06-16: 0.5 mg via ORAL
  Filled 2018-06-16: qty 1

## 2018-06-16 NOTE — Progress Notes (Signed)
Echo postponed due to exposure risk COVID 19 Ordered BNP anticipate doing when droplet precautions done Or if clinical status deteriorates  Marvin Brooks

## 2018-06-16 NOTE — Plan of Care (Signed)
  Problem: Education: Goal: Knowledge of General Education information will improve Description Including pain rating scale, medication(s)/side effects and non-pharmacologic comfort measures Outcome: Not Progressing Pt has significant intermittent confusion, undresses, pulls at lines, attempts to get out of bed, very forgetful. Bed alarm on, tele-sitter utilized. Will continue to monitor.   Problem: Health Behavior/Discharge Planning: Goal: Ability to manage health-related needs will improve Outcome: Not Progressing  Will speak with MD re: SW/CM consult as well as PT for recent falls prior to admission.   Problem: Clinical Measurements: Goal: Respiratory complications will improve Outcome: Progressing Goal: Cardiovascular complication will be avoided Outcome: Progressing

## 2018-06-16 NOTE — Progress Notes (Signed)
PHARMACY - PHYSICIAN COMMUNICATION CRITICAL VALUE ALERT - BLOOD CULTURE IDENTIFICATION (BCID)  Marvin Brooks is an 85 y.o. male who presented to [facility] on 06/15/2018 with a chief complaint of fall.  Assessment:  ED workup showed mild lyphopenia/fever/infiltrates. He was started on ceftriaxone/azith for CAP. Lab called tonight with BCID result>>1/4 bottles with staph species. This is likely to be contaminant CNS. No mecA detected.   Name of physician (or Provider) Contacted: Lorain Childes Maren Reamer, NP  Current antibiotics: Ceftriaxone/azith  Changes to prescribed antibiotics recommended:  No change in abx  Results for orders placed or performed during the hospital encounter of 06/15/18  Blood Culture ID Panel (Reflexed) (Collected: 06/15/2018  4:47 PM)  Result Value Ref Range   Enterococcus species NOT DETECTED NOT DETECTED   Listeria monocytogenes NOT DETECTED NOT DETECTED   Staphylococcus species DETECTED (A) NOT DETECTED   Staphylococcus aureus (BCID) NOT DETECTED NOT DETECTED   Methicillin resistance NOT DETECTED NOT DETECTED   Streptococcus species NOT DETECTED NOT DETECTED   Streptococcus agalactiae NOT DETECTED NOT DETECTED   Streptococcus pneumoniae NOT DETECTED NOT DETECTED   Streptococcus pyogenes NOT DETECTED NOT DETECTED   Acinetobacter baumannii NOT DETECTED NOT DETECTED   Enterobacteriaceae species NOT DETECTED NOT DETECTED   Enterobacter cloacae complex NOT DETECTED NOT DETECTED   Escherichia coli NOT DETECTED NOT DETECTED   Klebsiella oxytoca NOT DETECTED NOT DETECTED   Klebsiella pneumoniae NOT DETECTED NOT DETECTED   Proteus species NOT DETECTED NOT DETECTED   Serratia marcescens NOT DETECTED NOT DETECTED   Haemophilus influenzae NOT DETECTED NOT DETECTED   Neisseria meningitidis NOT DETECTED NOT DETECTED   Pseudomonas aeruginosa NOT DETECTED NOT DETECTED   Candida albicans NOT DETECTED NOT DETECTED   Candida glabrata NOT DETECTED NOT DETECTED   Candida krusei  NOT DETECTED NOT DETECTED   Candida parapsilosis NOT DETECTED NOT DETECTED   Candida tropicalis NOT DETECTED NOT DETECTED    Ulyses Southward, PharmD, BCIDP, AAHIVP, CPP Infectious Disease Pharmacist 06/16/2018 8:33 PM

## 2018-06-16 NOTE — Progress Notes (Signed)
PROGRESS NOTE    Marvin Brooks  ULA:[id number] DOB: 14-Nov-1926 DOA: 06/15/2018 PCP: Merri Brunette, MD    Brief Narrative:  85 year old male who presented with cough and dyspnea.  He does have the significant past medical history for hypertension, dyslipidemia, hypothyroidism and macular degeneration.  Apparently he sustained a mechanical fall in the bathtub, no head injury or loss of consciousness.  Progressive weakness for the last 3 days associated with cough and dyspnea.  On his initial physical examination he was febrile 39 C, blood pressure 156/66, heart rate 67, respiratory rate 17, oxygen saturation 96% on the portable oxygen, his lungs had decreased breath sounds bilaterally, heart S1-2 present rhythmic, abdomen soft nontender, no lower extremity edema.  Sodium 140, potassium 3.7, chloride 113, bicarb 19, glucose 170, BUN 24, creatinine 1.24, white count 8.1, hemoglobin 10.7, hematocrit 34.8, platelets 145. Influenza  A & B negative. Urinalysis negative for infection.  His chest x-ray had bilateral, bibasilar interstitial infiltrates.  EKG 97 bpm, first-degree AV block, normal axis, ST segment depression V5 V6, no significant t wave changes, normal QTc and poor r wave progression.   Patient was admitted to the hospital with acute hypoxic respiratory failure possible bilateral pneumonia.   Patient is under investigation for COVID 19  Assessment & Plan:   Active Problems:   Pneumonia   1. Acute hypoxic respiratory failure due to possible bilateral lower lobe pneumonia, to rule out pulmonary edema. Will continue oxymetry monitoring and supplemental 02 per [place]. Current oxygen saturation is 95% on 2 LPM per [place]. Continue antibiotic therapy with ceftriaxone IV and azithromycin po. I suspect patient may have component of pulmonary edema, will do trial of furosemide 40 mg Iv x1. Follow on chest film in am. Patient tested negative for influenza A and B, pending COVID results, will continue  droplet precautions. Check echocardiogram.   2. HTN. Continue blood pressure monitoring, will continue to hold on blood pressure agents for now.   3. Hypothyroid. Will continue levothyroxine.   4. BPH. Will continue finasteride.   5. Dyslipidemia. Will continue atorvastatin.    DVT prophylaxis:  Enoxaparin   Code Status:  full Family Communication: no family at the bedside  Disposition Plan/ discharge barriers: pending clinical improvement.   Body mass index is 26.31 kg/m. Malnutrition Type:      Malnutrition Characteristics:      Nutrition Interventions:     RN Pressure Injury Documentation:     Consultants:     Procedures:     Antimicrobials:   Ceftriaxone   Azithromycin     Subjective: Patient continue to have dyspnea, not yet back to baseline, no nausea or vomiting, no chest pain.   Objective: Vitals:   06/15/18 1945 06/15/18 2027 06/16/18 0600 06/16/18 0727  BP:  (!) 158/72  (!) 153/61  Pulse: (!) 42 63  75  Resp: (!) 23 20  16   Temp:  98.4 F (36.9 C) 99.1 F (37.3 C) 99.3 F (37.4 C)  TempSrc:  Oral Oral Oral  SpO2: 95% 94%  95%  Weight:  76.2 kg    Height:  5\' 7"  (1.702 m)      Intake/Output Summary (Last 24 hours) at 06/16/2018 1313 Last data filed at 06/16/2018 0600 Gross per 24 hour  Intake 240 ml  Output 500 ml  Net -260 ml   Filed Weights   06/15/18 1653 06/15/18 2027  Weight: 81.6 kg 76.2 kg    Examination:   General: Not in pain or dyspnea, deconditioned  Neurology: Awake and alert, non focal  E ENT: mild pallor, no icterus, oral mucosa moist Cardiovascular: No JVD. S1-S2 present, rhythmic, no gallops, rubs, or murmurs. No lower extremity edema. Pulmonary: positive breath sounds bilaterally, no wheezing, or rhonchi, positive bibasilar rales. Gastrointestinal. Abdomen with no organomegaly, non tender, no rebound or guarding Skin. No rashes Musculoskeletal: no joint deformities     Data Reviewed: I have  personally reviewed following labs and imaging studies  CBC: Recent Labs  Lab 06/15/18 1656  WBC 8.1  NEUTROABS 7.4  HGB 10.7*  HCT 34.8*  MCV 91.6  PLT 145*   Basic Metabolic Panel: Recent Labs  Lab 06/15/18 1656  NA 140  K 3.7  CL 113*  CO2 19*  GLUCOSE 170*  BUN 24*  CREATININE 1.24  CALCIUM 9.1   GFR: Estimated Creatinine Clearance: 37.8 mL/min (by C-G formula based on SCr of 1.24 mg/dL). Liver Function Tests: Recent Labs  Lab 06/15/18 1656  AST 19  ALT 13  ALKPHOS 88  BILITOT 0.7  PROT 6.5  ALBUMIN 3.3*   No results for input(s): LIPASE, AMYLASE in the last 168 hours. No results for input(s): AMMONIA in the last 168 hours. Coagulation Profile: No results for input(s): INR, PROTIME in the last 168 hours. Cardiac Enzymes: No results for input(s): CKTOTAL, CKMB, CKMBINDEX, TROPONINI in the last 168 hours. BNP (last 3 results) No results for input(s): PROBNP in the last 8760 hours. HbA1C: No results for input(s): HGBA1C in the last 72 hours. CBG: No results for input(s): GLUCAP in the last 168 hours. Lipid Profile: No results for input(s): CHOL, HDL, LDLCALC, TRIG, CHOLHDL, LDLDIRECT in the last 72 hours. Thyroid Function Tests: No results for input(s): TSH, T4TOTAL, FREET4, T3FREE, THYROIDAB in the last 72 hours. Anemia Panel: No results for input(s): VITAMINB12, FOLATE, FERRITIN, TIBC, IRON, RETICCTPCT in the last 72 hours.    Radiology Studies: I have reviewed all of the imaging during this hospital visit personally     Scheduled Meds:  amLODipine  5 mg Oral Daily   atorvastatin  40 mg Oral q1800   azithromycin  500 mg Oral Q24H   enoxaparin (LOVENOX) injection  40 mg Subcutaneous Q24H   finasteride  5 mg Oral Daily   gabapentin  300 mg Oral QHS   levothyroxine  100 mcg Oral Q0600   Continuous Infusions:  cefTRIAXone (ROCEPHIN)  IV       LOS: 1 day        Dola Lunsford Annett Gula, MD

## 2018-06-16 NOTE — Plan of Care (Signed)
Pt admitted today will continue with plan of care.

## 2018-06-17 ENCOUNTER — Inpatient Hospital Stay (HOSPITAL_COMMUNITY): Payer: Medicare Other

## 2018-06-17 DIAGNOSIS — E038 Other specified hypothyroidism: Secondary | ICD-10-CM

## 2018-06-17 DIAGNOSIS — M5481 Occipital neuralgia: Secondary | ICD-10-CM

## 2018-06-17 LAB — CBC WITH DIFFERENTIAL/PLATELET
Abs Immature Granulocytes: 0.03 10*3/uL (ref 0.00–0.07)
Basophils Absolute: 0 10*3/uL (ref 0.0–0.1)
Basophils Relative: 0 %
EOS PCT: 0 %
Eosinophils Absolute: 0 10*3/uL (ref 0.0–0.5)
HCT: 37 % — ABNORMAL LOW (ref 39.0–52.0)
Hemoglobin: 11.5 g/dL — ABNORMAL LOW (ref 13.0–17.0)
Immature Granulocytes: 0 %
Lymphocytes Relative: 8 %
Lymphs Abs: 0.7 10*3/uL (ref 0.7–4.0)
MCH: 28.1 pg (ref 26.0–34.0)
MCHC: 31.1 g/dL (ref 30.0–36.0)
MCV: 90.5 fL (ref 80.0–100.0)
Monocytes Absolute: 0.8 10*3/uL (ref 0.1–1.0)
Monocytes Relative: 9 %
NRBC: 0 % (ref 0.0–0.2)
Neutro Abs: 7.2 10*3/uL (ref 1.7–7.7)
Neutrophils Relative %: 83 %
Platelets: 141 10*3/uL — ABNORMAL LOW (ref 150–400)
RBC: 4.09 MIL/uL — ABNORMAL LOW (ref 4.22–5.81)
RDW: 13 % (ref 11.5–15.5)
WBC: 8.7 10*3/uL (ref 4.0–10.5)

## 2018-06-17 LAB — BASIC METABOLIC PANEL
Anion gap: 10 (ref 5–15)
BUN: 20 mg/dL (ref 8–23)
CALCIUM: 8.8 mg/dL — AB (ref 8.9–10.3)
CO2: 26 mmol/L (ref 22–32)
Chloride: 106 mmol/L (ref 98–111)
Creatinine, Ser: 1.19 mg/dL (ref 0.61–1.24)
GFR calc non Af Amer: 54 mL/min — ABNORMAL LOW (ref >60)
Glucose, Bld: 107 mg/dL — ABNORMAL HIGH (ref 70–99)
Potassium: 3.2 mmol/L — ABNORMAL LOW (ref 3.5–5.1)
Sodium: 142 mmol/L (ref 135–145)

## 2018-06-17 MED ORDER — AMOXICILLIN-POT CLAVULANATE 875-125 MG PO TABS
1.0000 | ORAL_TABLET | Freq: Two times a day (BID) | ORAL | Status: DC
  Administered 2018-06-17: 1 via ORAL
  Filled 2018-06-17: qty 1

## 2018-06-17 MED ORDER — AMOXICILLIN-POT CLAVULANATE 875-125 MG PO TABS: 1.0000 | ORAL_TABLET | Freq: Two times a day (BID) | ORAL | 0 refills | Status: AC

## 2018-06-17 NOTE — TOC Transition Note (Signed)
Transition of Care [facility]) - CM/SW Discharge Note   Patient Details  Name: Marvin Brooks MRN: [id number] Date of Birth: Dec 17, 1926  [facility] CM/SW Contact:  Leone Haven, RN Phone Number: 06/17/2018, 10:58 AM   Clinical Narrative:    Patient is for discharge today, Patient gave permission for NCM to speak with wife, she states they would like to continue with [facility] for HHRN, HHPT, HHOT.  Lupita Leash with [facility] notified that patient is for dc today. They will resume services.     Final next level of care: Home w Home Health Services Barriers to Discharge: No Barriers Identified   Patient Goals and CMS Choice Patient states their goals for this hospitalization and ongoing recovery are:: to get better CMS Medicare.gov Compare Post Acute Care list provided to:: Other (Comment Required)(wife) Choice offered to / list presented to : Spouse  Discharge Placement                       Discharge Plan and Services In-house Referral: NA Discharge Planning Services: CM Consult Post Acute Care Choice: Home Health, Resumption of Svcs/PTA Provider          DME Arranged: N/A DME Agency: NA HH Arranged: RN, PT, OT HH Agency: [facility]   Social Determinants of Health (SDOH) Interventions     Readmission Risk Interventions No flowsheet data found.

## 2018-06-17 NOTE — Discharge Summary (Addendum)
Physician Discharge Summary  Marvin Brooks HUT:[id number] DOB: 19-May-1926 DOA: 06/15/2018  PCP: Merri Brunette, MD  Admit date: 06/15/2018 Discharge date: 06/17/2018  Admitted From: Home  Disposition:  Home  Recommendations for Outpatient Follow-up and new medication changes:  1. Follow up with Dr. Katrinka Blazing in 7 days.  2. Patient will continue taking Augmentin for the next 5 days.  3. Follow-up echocardiogram as an outpatient 4. COVID Negative   Home Health: no   Equipment/Devices: no    Discharge Condition: stable  CODE STATUS: full  Diet recommendation: Heart healthy   Brief/Interim Summary: 85 year old male who presented with cough and dyspnea.  He does have the significant past medical history for hypertension, dyslipidemia, hypothyroidism and macular degeneration.  Apparently he sustained a mechanical fall in the bathtub, no head injury or loss of consciousness.  Progressive weakness for the last 3 days associated with cough and dyspnea.  On his initial physical examination he was febrile 39 C, blood pressure 156/66, heart rate 67, respiratory rate 17, oxygen saturation 96% on supplemental oxygen, his lungs had decreased breath sounds bilaterally, heart S1-2 present rhythmic, abdomen soft nontender, no lower extremity edema.  Sodium 140, potassium 3.7, chloride 113, bicarb 19, glucose 170, BUN 24, creatinine 1.24, white count 8.1, hemoglobin 10.7, hematocrit 34.8, platelets 145. Influenza  A & B negative. Urinalysis negative for infection.  His chest x-ray had bilateral, bibasilar interstitial infiltrates.  EKG 97 bpm, first-degree AV block, normal axis, ST segment depression V5 V6, no significant t wave changes, normal QTc and poor r wave progression.   Patient was admitted to the hospital with the working diagnosis of acute hypoxic respiratory failure due to bilateral pneumonia.   1.  Acute hypoxic respiratory failure due to bilateral lower lobe pneumonia, present on admission.   Patient was admitted to the medical ward, he will receive supplemental oxygen per nasal cannula and oximetry monitoring.  IV antibiotic therapy with ceftriaxone and oral azithromycin.  Patient has remained afebrile, his white cell count at discharge is 8.7.  1 bottle blood culture was positive for coag negative staph, considered a contaminant.  Second bottle was no growth.  Patient will be discharged home to continue antibiotic therapy with Augmentin for the next 5 days.  2.  Pulmonary edema.  Chest film suggestive of pulmonary edema component, unable to diagnose formal heart failure at this point due to COVID restrictions.  Patient will follow-up as an outpatient with his primary care provider for possible echocardiography.  Patient did receive 40 mg of furosemide x1.  3.  Hypertension.  His blood pressure remained stable during his hospitalization. At discharge patient will resume amlodipine.   4.  Hypothyroidism.  Continue levothyroxine.  5.  BPH.  Continue finasteride.  6.  Dyslipidemia.  Patient will continue atorvastatin.   Discharge Diagnoses:  Active Problems:   HTN (hypertension)   Hypothyroidism   Bilateral occipital neuralgia   Macular degeneration   Pneumonia    Discharge Instructions   Allergies as of 06/17/2018      Reactions   Sulfur Rash   Cause rash   Sulfa Antibiotics Rash      Medication List    STOP taking these medications   gabapentin 300 MG capsule Commonly known as:  NEURONTIN     TAKE these medications   acetaminophen 500 MG tablet Commonly known as:  TYLENOL Take 500-1,000 mg by mouth every 6 (six) hours as needed for headache (pain).   amLODipine 10 MG tablet Commonly known as:  NORVASC Take 10 mg by mouth daily.   amoxicillin-clavulanate 875-125 MG tablet Commonly known as:  AUGMENTIN Take 1 tablet by mouth every 12 (twelve) hours for 5 days.   atorvastatin 40 MG tablet Commonly known as:  LIPITOR Take 40 mg by mouth daily.    finasteride 5 MG tablet Commonly known as:  PROSCAR Take 5 mg by mouth daily.   levothyroxine 100 MCG tablet Commonly known as:  SYNTHROID, LEVOTHROID Take 100 mcg by mouth daily.   oxymetazoline 0.05 % nasal spray Commonly known as:  AFRIN Place 1 spray into both nostrils 2 (two) times daily as needed for congestion.   PreserVision AREDS 2 Caps Take 1 capsule by mouth daily.   terazosin 10 MG capsule Commonly known as:  HYTRIN Take 10 mg by mouth daily.       Allergies  Allergen Reactions  . Sulfur Rash    Cause rash  . Sulfa Antibiotics Rash    Consultations:     Procedures/Studies: Dg Chest Portable 1 View  Result Date: 06/15/2018 CLINICAL DATA:  Cough.  Fall. EXAM: PORTABLE CHEST 1 VIEW COMPARISON:  CT scan February 19, 2018 FINDINGS: Cardiomegaly. The hila and mediastinum are unremarkable. Increased interstitial markings, particularly in the bases. No focal infiltrate or other abnormality. IMPRESSION: 1. The increased interstitial opacities in the lung bases could represent mild edema or atypical infection. A chronic process such is interstitial lung disease is considered unlikely given no such findings on the CT scan from February 19, 2018. Electronically Signed   By: Gerome Sam III M.D   On: 06/15/2018 17:41       Subjective: Patient is feeling well, no nausea or vomiting, no chest pain or dyspnea.   Discharge Exam: Vitals:   06/16/18 1554 06/16/18 2032  BP: (!) 171/74 (!) 142/55  Pulse: 80 81  Resp: 16   Temp: 98.4 F (36.9 C) 99 F (37.2 C)  SpO2: 98% 94%   Vitals:   06/16/18 0600 06/16/18 0727 06/16/18 1554 06/16/18 2032  BP:  (!) 153/61 (!) 171/74 (!) 142/55  Pulse:  75 80 81  Resp:  16 16   Temp: 99.1 F (37.3 C) 99.3 F (37.4 C) 98.4 F (36.9 C) 99 F (37.2 C)  TempSrc: Oral Oral Oral Oral  SpO2:  95% 98% 94%  Weight:      Height:        General: Not in pain or dyspnea.  Neurology: Awake and alert, non focal  E ENT: mild  pallor, no icterus, oral mucosa moist Cardiovascular: No JVD. S1-S2 present, rhythmic, no gallops, rubs, or murmurs. No lower extremity edema. Pulmonary: positive breath sounds bilaterally, adequate air movement, no wheezing, or rhonchi, mild bibasilar rales. Gastrointestinal. Abdomen with no organomegaly, non tender, no rebound or guarding Skin. No rashes Musculoskeletal: no joint deformities   The results of significant diagnostics from this hospitalization (including imaging, microbiology, ancillary and laboratory) are listed below for reference.     Microbiology: Recent Results (from the past 240 hour(s))  Blood Culture (routine x 2)     Status: None (Preliminary result)   Collection Time: 06/15/18  4:47 PM  Result Value Ref Range Status   Specimen Description BLOOD LEFT ANTECUBITAL  Final   Special Requests   Final    BOTTLES DRAWN AEROBIC AND ANAEROBIC Blood Culture adequate volume   Culture  Setup Time   Final    GRAM POSITIVE COCCI AEROBIC BOTTLE ONLY Organism ID to follow CRITICAL RESULT CALLED TO, READ  BACK BY AND VERIFIED WITH: Gay Filler PHARMD 2025 06/16/18 A BROWNING    Culture   Final    NO GROWTH 2 DAYS Performed at [facility], [address], [place], [place] [address]    Report Status PENDING  Incomplete  Blood Culture ID Panel (Reflexed)     Status: Abnormal   Collection Time: 06/15/18  4:47 PM  Result Value Ref Range Status   Enterococcus species NOT DETECTED NOT DETECTED Final   Listeria monocytogenes NOT DETECTED NOT DETECTED Final   Staphylococcus species DETECTED (A) NOT DETECTED Final    Comment: Methicillin (oxacillin) susceptible coagulase negative staphylococcus. Possible blood culture contaminant (unless isolated from more than one blood culture draw or clinical case suggests pathogenicity). No antibiotic treatment is indicated for blood  culture contaminants. CRITICAL RESULT CALLED TO, READ BACK BY AND VERIFIED WITH: Gay Filler PHARMD 2025 06/16/18 A  BROWNING    Staphylococcus aureus (BCID) NOT DETECTED NOT DETECTED Final   Methicillin resistance NOT DETECTED NOT DETECTED Final   Streptococcus species NOT DETECTED NOT DETECTED Final   Streptococcus agalactiae NOT DETECTED NOT DETECTED Final   Streptococcus pneumoniae NOT DETECTED NOT DETECTED Final   Streptococcus pyogenes NOT DETECTED NOT DETECTED Final   Acinetobacter baumannii NOT DETECTED NOT DETECTED Final   Enterobacteriaceae species NOT DETECTED NOT DETECTED Final   Enterobacter cloacae complex NOT DETECTED NOT DETECTED Final   Escherichia coli NOT DETECTED NOT DETECTED Final   Klebsiella oxytoca NOT DETECTED NOT DETECTED Final   Klebsiella pneumoniae NOT DETECTED NOT DETECTED Final   Proteus species NOT DETECTED NOT DETECTED Final   Serratia marcescens NOT DETECTED NOT DETECTED Final   Haemophilus influenzae NOT DETECTED NOT DETECTED Final   Neisseria meningitidis NOT DETECTED NOT DETECTED Final   Pseudomonas aeruginosa NOT DETECTED NOT DETECTED Final   Candida albicans NOT DETECTED NOT DETECTED Final   Candida glabrata NOT DETECTED NOT DETECTED Final   Candida krusei NOT DETECTED NOT DETECTED Final   Candida parapsilosis NOT DETECTED NOT DETECTED Final   Candida tropicalis NOT DETECTED NOT DETECTED Final    Comment: Performed at [facility], [address], [place], [place] [address]  Blood Culture (routine x 2)     Status: None (Preliminary result)   Collection Time: 06/15/18  4:52 PM  Result Value Ref Range Status   Specimen Description BLOOD RIGHT ANTECUBITAL  Final   Special Requests   Final    BOTTLES DRAWN AEROBIC AND ANAEROBIC Blood Culture adequate volume   Culture   Final    NO GROWTH 2 DAYS Performed at [facility], [address] [place], [place] [address]    Report Status PENDING  Incomplete     Labs: BNP (last 3 results) No results for input(s): BNP in the last 8760 hours. Basic Metabolic Panel: Recent Labs  Lab 06/15/18 1656  06/17/18 0345  NA 140 142  K 3.7 3.2*  CL 113* 106  CO2 19* 26  GLUCOSE 170* 107*  BUN 24* 20  CREATININE 1.24 1.19  CALCIUM 9.1 8.8*   Liver Function Tests: Recent Labs  Lab 06/15/18 1656  AST 19  ALT 13  ALKPHOS 88  BILITOT 0.7  PROT 6.5  ALBUMIN 3.3*   No results for input(s): LIPASE, AMYLASE in the last 168 hours. No results for input(s): AMMONIA in the last 168 hours. CBC: Recent Labs  Lab 06/15/18 1656 06/17/18 0345  WBC 8.1 8.7  NEUTROABS 7.4 7.2  HGB 10.7* 11.5*  HCT 34.8* 37.0*  MCV 91.6 90.5  PLT 145* 141*   Cardiac Enzymes: No results for input(s): CKTOTAL, CKMB, CKMBINDEX, TROPONINI in the last 168 hours. BNP: Invalid input(s): POCBNP CBG: No results for input(s): GLUCAP in the last 168 hours. D-Dimer No results for input(s): DDIMER in the last 72 hours. Hgb A1c No results for input(s): HGBA1C in the last 72 hours. Lipid Profile No results for input(s): CHOL, HDL, LDLCALC, TRIG, CHOLHDL, LDLDIRECT in the last 72 hours. Thyroid function studies No results for input(s): TSH, T4TOTAL, T3FREE, THYROIDAB in the last 72 hours.  Invalid input(s): FREET3 Anemia work up No results for input(s): VITAMINB12, FOLATE, FERRITIN, TIBC, IRON, RETICCTPCT in the last 72 hours. Urinalysis    Component Value Date/Time   COLORURINE YELLOW 06/15/2018 1656   APPEARANCEUR HAZY (A) 06/15/2018 1656   LABSPEC 1.030 06/15/2018 1656   PHURINE 5.0 06/15/2018 1656   GLUCOSEU NEGATIVE 06/15/2018 1656   HGBUR NEGATIVE 06/15/2018 1656   BILIRUBINUR NEGATIVE 06/15/2018 1656   KETONESUR 5 (A) 06/15/2018 1656   PROTEINUR 100 (A) 06/15/2018 1656   NITRITE NEGATIVE 06/15/2018 1656   LEUKOCYTESUR NEGATIVE 06/15/2018 1656   Sepsis Labs Invalid input(s): PROCALCITONIN,  WBC,  LACTICIDVEN Microbiology Recent Results (from the past 240 hour(s))  Blood Culture (routine x 2)     Status: None (Preliminary result)   Collection Time: 06/15/18  4:47 PM  Result Value Ref Range  Status   Specimen Description BLOOD LEFT ANTECUBITAL  Final   Special Requests   Final    BOTTLES DRAWN AEROBIC AND ANAEROBIC Blood Culture adequate volume   Culture  Setup Time   Final    GRAM POSITIVE COCCI AEROBIC BOTTLE ONLY Organism ID to follow CRITICAL RESULT CALLED TO, READ BACK BY AND VERIFIED WITH: Gay Filler PHARMD 2025 06/16/18 A BROWNING    Culture   Final    NO GROWTH 2 DAYS Performed at [facility], [address], [place], [place] [address]    Report Status PENDING  Incomplete  Blood Culture ID Panel (Reflexed)     Status: Abnormal   Collection Time: 06/15/18  4:47 PM  Result Value Ref Range Status   Enterococcus species NOT DETECTED NOT DETECTED Final   Listeria monocytogenes NOT DETECTED NOT DETECTED Final   Staphylococcus species DETECTED (A) NOT DETECTED Final    Comment: Methicillin (oxacillin) susceptible coagulase negative staphylococcus. Possible blood culture contaminant (unless isolated from more than one blood culture draw or clinical case suggests pathogenicity). No antibiotic treatment is indicated for blood  culture contaminants. CRITICAL RESULT CALLED TO, READ BACK BY AND VERIFIED WITH: Gay Filler PHARMD 2025 06/16/18 A BROWNING    Staphylococcus aureus (BCID) NOT DETECTED NOT DETECTED Final   Methicillin resistance NOT DETECTED NOT DETECTED Final   Streptococcus species NOT DETECTED NOT DETECTED Final   Streptococcus agalactiae NOT DETECTED NOT DETECTED Final   Streptococcus pneumoniae NOT DETECTED NOT DETECTED Final   Streptococcus pyogenes NOT DETECTED NOT DETECTED Final   Acinetobacter baumannii NOT DETECTED NOT DETECTED Final   Enterobacteriaceae species NOT DETECTED NOT DETECTED Final   Enterobacter cloacae complex NOT DETECTED NOT DETECTED Final   Escherichia coli NOT DETECTED NOT DETECTED Final   Klebsiella oxytoca NOT DETECTED NOT DETECTED Final   Klebsiella pneumoniae NOT DETECTED NOT DETECTED Final   Proteus species NOT DETECTED NOT  DETECTED Final   Serratia marcescens NOT DETECTED NOT DETECTED Final   Haemophilus influenzae NOT DETECTED NOT DETECTED Final   Neisseria meningitidis NOT DETECTED  NOT DETECTED Final   Pseudomonas aeruginosa NOT DETECTED NOT DETECTED Final   Candida albicans NOT DETECTED NOT DETECTED Final   Candida glabrata NOT DETECTED NOT DETECTED Final   Candida krusei NOT DETECTED NOT DETECTED Final   Candida parapsilosis NOT DETECTED NOT DETECTED Final   Candida tropicalis NOT DETECTED NOT DETECTED Final    Comment: Performed at [facility], [address], [place], [place] [address]  Blood Culture (routine x 2)     Status: None (Preliminary result)   Collection Time: 06/15/18  4:52 PM  Result Value Ref Range Status   Specimen Description BLOOD RIGHT ANTECUBITAL  Final   Special Requests   Final    BOTTLES DRAWN AEROBIC AND ANAEROBIC Blood Culture adequate volume   Culture   Final    NO GROWTH 2 DAYS Performed at [facility], [address] [place], [place] [address]    Report Status PENDING  Incomplete     Time coordinating discharge: 45 minutes  SIGNED:   Coralie Keens, MD  [facility] 06/17/2018, 8:10 AM

## 2018-06-17 NOTE — Plan of Care (Signed)
  Problem: Clinical Measurements: Goal: Will remain free from infection 06/17/2018 0218 by Verdia Kuba, RN Outcome: Progressing 06/17/2018 0217 by Verdia Kuba, RN Outcome: Progressing 06/17/2018 0215 by Verdia Kuba, RN Outcome: Progressing   Problem: Health Behavior/Discharge Planning: Goal: Ability to manage health-related needs will improve 06/17/2018 0217 by Verdia Kuba, RN Outcome: Progressing 06/17/2018 0215 by Verdia Kuba, RN Outcome: Progressing   Problem: Clinical Measurements: Goal: Respiratory complications will improve 06/17/2018 0217 by Verdia Kuba, RN Outcome: Progressing 06/17/2018 0215 by Verdia Kuba, RN Outcome: Progressing   Problem: Education: Goal: Knowledge of General Education information will improve Description Including pain rating scale, medication(s)/side effects and non-pharmacologic comfort measures 06/17/2018 0218 by Verdia Kuba, RN Outcome: Not Met (add Reason) 06/17/2018 0217 by Verdia Kuba, RN Outcome: Progressing 06/17/2018 0215 by Verdia Kuba, RN Outcome: Progressing   Problem: Safety: Goal: Ability to remain free from injury will improve 06/17/2018 0218 by Verdia Kuba, RN Outcome: Not Met (add Reason) 06/17/2018 0217 by Verdia Kuba, RN Outcome: Not Met - Tele sitter 06/17/2018 0215 by Verdia Kuba, RN Outcome: Progressing

## 2018-06-17 NOTE — Progress Notes (Signed)
Pt 92% on RA

## 2018-06-17 NOTE — Plan of Care (Deleted)
  Problem: Education: Goal: Knowledge of General Education information will improve Description: Including pain rating scale, medication(s)/side effects and non-pharmacologic comfort measures Outcome: Progressing   Problem: Clinical Measurements: Goal: Will remain free from infection Outcome: Progressing   Problem: Clinical Measurements: Goal: Diagnostic test results will improve Outcome: Progressing   Problem: Clinical Measurements: Goal: Respiratory complications will improve Outcome: Progressing   Problem: Activity: Goal: Risk for activity intolerance will decrease Outcome: Progressing   Problem: Safety: Goal: Ability to remain free from injury will improve Outcome: Progressing   Problem: Skin Integrity: Goal: Risk for impaired skin integrity will decrease Outcome: Progressing   

## 2018-06-18 LAB — NOVEL CORONAVIRUS, NAA (HOSP ORDER, SEND-OUT TO REF LAB; TAT 18-24 HRS): SARS-CoV-2, NAA: NOT DETECTED

## 2018-06-20 LAB — CULTURE, BLOOD (ROUTINE X 2)
Culture: NO GROWTH
Special Requests: ADEQUATE
Special Requests: ADEQUATE

## 2018-06-23 ENCOUNTER — Telehealth: Payer: Self-pay | Admitting: *Deleted

## 2018-06-23 DIAGNOSIS — Z09 Encounter for follow-up examination after completed treatment for conditions other than malignant neoplasm: Secondary | ICD-10-CM | POA: Diagnosis not present

## 2018-06-23 DIAGNOSIS — J189 Pneumonia, unspecified organism: Secondary | ICD-10-CM | POA: Diagnosis not present

## 2018-06-23 DIAGNOSIS — R0602 Shortness of breath: Secondary | ICD-10-CM | POA: Diagnosis not present

## 2018-06-23 NOTE — Telephone Encounter (Signed)
REFERRAL SENT TO SCHEDULING AND NOTES ON FILE FROM [facility] CANDACE SMITH [phone].

## 2018-07-29 ENCOUNTER — Other Ambulatory Visit (HOSPITAL_COMMUNITY): Payer: Self-pay | Admitting: Family Medicine

## 2018-07-29 ENCOUNTER — Other Ambulatory Visit: Payer: Self-pay | Admitting: Family Medicine

## 2018-07-29 ENCOUNTER — Telehealth (HOSPITAL_COMMUNITY): Payer: Self-pay | Admitting: *Deleted

## 2018-07-29 DIAGNOSIS — R0602 Shortness of breath: Secondary | ICD-10-CM

## 2018-07-29 NOTE — Telephone Encounter (Signed)
Patient wishes to wait until June to have echo   [facility], [facility].

## 2018-07-31 ENCOUNTER — Other Ambulatory Visit (HOSPITAL_COMMUNITY): Payer: Medicare Other

## 2018-10-13 DIAGNOSIS — Z1389 Encounter for screening for other disorder: Secondary | ICD-10-CM | POA: Diagnosis not present

## 2018-10-13 DIAGNOSIS — I10 Essential (primary) hypertension: Secondary | ICD-10-CM | POA: Diagnosis not present

## 2018-10-13 DIAGNOSIS — E039 Hypothyroidism, unspecified: Secondary | ICD-10-CM | POA: Diagnosis not present

## 2018-10-13 DIAGNOSIS — Z0000 Encounter for general adult medical examination without abnormal findings: Secondary | ICD-10-CM | POA: Diagnosis not present

## 2018-10-16 DIAGNOSIS — E039 Hypothyroidism, unspecified: Secondary | ICD-10-CM | POA: Diagnosis not present

## 2018-10-16 DIAGNOSIS — I10 Essential (primary) hypertension: Secondary | ICD-10-CM | POA: Diagnosis not present

## 2018-10-16 DIAGNOSIS — N400 Enlarged prostate without lower urinary tract symptoms: Secondary | ICD-10-CM | POA: Diagnosis not present

## 2018-10-16 DIAGNOSIS — E1169 Type 2 diabetes mellitus with other specified complication: Secondary | ICD-10-CM | POA: Diagnosis not present

## 2018-12-01 DIAGNOSIS — D649 Anemia, unspecified: Secondary | ICD-10-CM | POA: Diagnosis not present

## 2018-12-01 DIAGNOSIS — Z23 Encounter for immunization: Secondary | ICD-10-CM | POA: Diagnosis not present

## 2019-01-26 DIAGNOSIS — D649 Anemia, unspecified: Secondary | ICD-10-CM | POA: Diagnosis not present

## 2019-01-26 DIAGNOSIS — I10 Essential (primary) hypertension: Secondary | ICD-10-CM | POA: Diagnosis not present

## 2019-02-25 DIAGNOSIS — C649 Malignant neoplasm of unspecified kidney, except renal pelvis: Secondary | ICD-10-CM | POA: Diagnosis not present

## 2019-02-25 DIAGNOSIS — C641 Malignant neoplasm of right kidney, except renal pelvis: Secondary | ICD-10-CM | POA: Diagnosis not present

## 2019-02-25 DIAGNOSIS — D3502 Benign neoplasm of left adrenal gland: Secondary | ICD-10-CM | POA: Diagnosis not present

## 2019-03-09 DIAGNOSIS — N281 Cyst of kidney, acquired: Secondary | ICD-10-CM | POA: Diagnosis not present

## 2019-03-09 DIAGNOSIS — R350 Frequency of micturition: Secondary | ICD-10-CM | POA: Diagnosis not present

## 2019-03-09 DIAGNOSIS — N401 Enlarged prostate with lower urinary tract symptoms: Secondary | ICD-10-CM | POA: Diagnosis not present

## 2019-03-09 DIAGNOSIS — C641 Malignant neoplasm of right kidney, except renal pelvis: Secondary | ICD-10-CM | POA: Diagnosis not present

## 2019-04-16 DIAGNOSIS — I10 Essential (primary) hypertension: Secondary | ICD-10-CM | POA: Diagnosis not present

## 2019-04-16 DIAGNOSIS — E1169 Type 2 diabetes mellitus with other specified complication: Secondary | ICD-10-CM | POA: Diagnosis not present

## 2019-04-16 DIAGNOSIS — E039 Hypothyroidism, unspecified: Secondary | ICD-10-CM | POA: Diagnosis not present

## 2019-04-16 DIAGNOSIS — C641 Malignant neoplasm of right kidney, except renal pelvis: Secondary | ICD-10-CM | POA: Diagnosis not present

## 2019-05-18 DIAGNOSIS — E1169 Type 2 diabetes mellitus with other specified complication: Secondary | ICD-10-CM | POA: Diagnosis not present

## 2019-05-18 DIAGNOSIS — D649 Anemia, unspecified: Secondary | ICD-10-CM | POA: Diagnosis not present

## 2019-09-29 DIAGNOSIS — E1169 Type 2 diabetes mellitus with other specified complication: Secondary | ICD-10-CM | POA: Diagnosis not present

## 2019-09-29 DIAGNOSIS — I10 Essential (primary) hypertension: Secondary | ICD-10-CM | POA: Diagnosis not present

## 2019-09-29 DIAGNOSIS — N400 Enlarged prostate without lower urinary tract symptoms: Secondary | ICD-10-CM | POA: Diagnosis not present

## 2019-09-29 DIAGNOSIS — E039 Hypothyroidism, unspecified: Secondary | ICD-10-CM | POA: Diagnosis not present

## 2020-02-17 DEATH — deceased

## 2020-10-10 IMAGING — DX PORTABLE CHEST - 1 VIEW
1 series · 1 of 1 positions shown · non-contrast
Comparison: CT scan February 19, 2018

CLINICAL DATA: Cough.  Fall.

EXAM:
PORTABLE CHEST 1 VIEW

[chest ap]
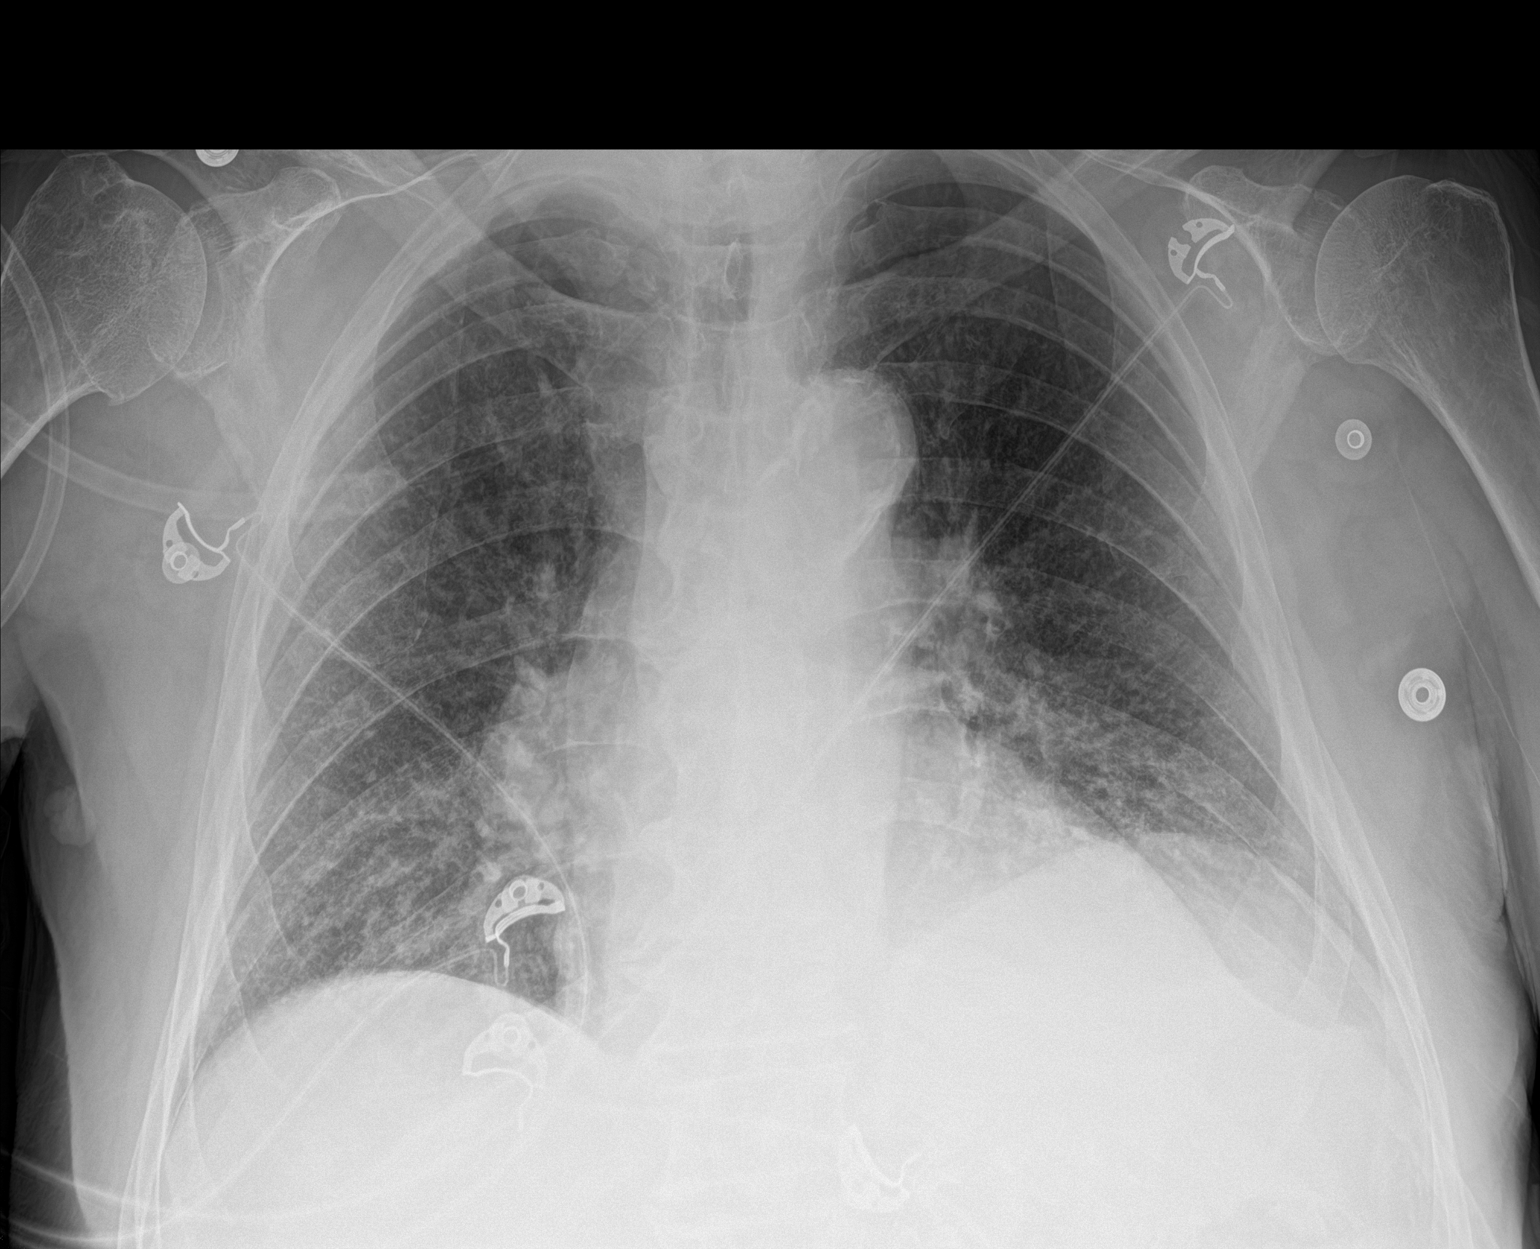

[1 of 1 positions shown; findings below may reference images not displayed]

FINDINGS: Cardiomegaly. The hila and mediastinum are unremarkable. Increased
interstitial markings, particularly in the bases. No focal
infiltrate or other abnormality.
IMPRESSION: 1. The increased interstitial opacities in the lung bases could
represent mild edema or atypical infection. A chronic process such
is interstitial lung disease is considered unlikely given no such
findings on the CT scan from February 19, 2018.
